# Patient Record
Sex: Female | Born: 1985
Health system: Southern US, Community
[De-identification: ages and names within clinical notes are randomized; demographics above are authoritative.]

## PROBLEM LIST (undated history)

## (undated) DIAGNOSIS — G43909 Migraine, unspecified, not intractable, without status migrainosus: Secondary | ICD-10-CM

## (undated) DIAGNOSIS — Z3400 Encounter for supervision of normal first pregnancy, unspecified trimester: Secondary | ICD-10-CM

## (undated) DIAGNOSIS — E282 Polycystic ovarian syndrome: Secondary | ICD-10-CM

## (undated) DIAGNOSIS — Z8619 Personal history of other infectious and parasitic diseases: Secondary | ICD-10-CM

## (undated) DIAGNOSIS — O24419 Gestational diabetes mellitus in pregnancy, unspecified control: Secondary | ICD-10-CM

## (undated) DIAGNOSIS — Z8669 Personal history of other diseases of the nervous system and sense organs: Secondary | ICD-10-CM

## (undated) HISTORY — PX: TOOTH EXTRACTION: SUR596

## (undated) HISTORY — DX: Personal history of other infectious and parasitic diseases: Z86.19

## (undated) HISTORY — PX: WISDOM TOOTH EXTRACTION: SHX21

## (undated) HISTORY — DX: Personal history of other diseases of the nervous system and sense organs: Z86.69

## (undated) HISTORY — PX: NO PAST SURGERIES: SHX2092

## (undated) HISTORY — DX: Gestational diabetes mellitus in pregnancy, unspecified control: O24.419

---

## 2010-04-29 DIAGNOSIS — Z8632 Personal history of gestational diabetes: Secondary | ICD-10-CM

## 2010-04-29 HISTORY — DX: Personal history of gestational diabetes: Z86.32

## 2010-04-29 NOTE — L&D Delivery Note (Signed)
Delivery Note Pt pushed for about 45 min with great effort to deliver.  At 1:50 PM a viable female was delivered via Vaginal, Spontaneous Delivery (Presentation: Left Occiput Anterior).  APGAR: 9, 9; weight 7#2.8oz .   Placenta status: Intact, Spontaneous.  Cord: 3 vessels with the following complications: None.    Anesthesia: Epidural  Episiotomy: None Lacerations: 2nd degree;Labial Suture Repair: 3.0 vicryl rapide Est. Blood Loss (mL): 300  Mom to postpartum.  Baby to nursery-stable.  Br, A+, ?Contra  BOVARD,Jayjay Littles 01/11/2011, 2:13 PM

## 2010-07-06 LAB — ANTIBODY SCREEN: Antibody Screen: NEGATIVE

## 2010-07-06 LAB — RUBELLA ANTIBODY, IGM: Rubella: IMMUNE

## 2010-07-06 LAB — ABO/RH: RH Type: POSITIVE

## 2010-07-06 LAB — HEPATITIS B SURFACE ANTIGEN: Hepatitis B Surface Ag: NEGATIVE

## 2011-01-11 ENCOUNTER — Inpatient Hospital Stay (HOSPITAL_COMMUNITY): Payer: BC Managed Care – PPO | Admitting: Anesthesiology

## 2011-01-11 ENCOUNTER — Inpatient Hospital Stay (HOSPITAL_COMMUNITY)
Admission: AD | Admit: 2011-01-11 | Discharge: 2011-01-13 | DRG: 373 | Disposition: A | Discharge status: Home / Self Care | Payer: BC Managed Care – PPO | Source: Ambulatory Visit | Attending: Obstetrics and Gynecology | Admitting: Obstetrics and Gynecology

## 2011-01-11 ENCOUNTER — Encounter (HOSPITAL_COMMUNITY): Payer: Self-pay | Admitting: *Deleted

## 2011-01-11 ENCOUNTER — Encounter (HOSPITAL_COMMUNITY): Payer: Self-pay | Admitting: Anesthesiology

## 2011-01-11 DIAGNOSIS — D689 Coagulation defect, unspecified: Secondary | ICD-10-CM | POA: Diagnosis not present

## 2011-01-11 DIAGNOSIS — D696 Thrombocytopenia, unspecified: Secondary | ICD-10-CM | POA: Diagnosis not present

## 2011-01-11 DIAGNOSIS — Z3400 Encounter for supervision of normal first pregnancy, unspecified trimester: Secondary | ICD-10-CM

## 2011-01-11 DIAGNOSIS — E282 Polycystic ovarian syndrome: Secondary | ICD-10-CM

## 2011-01-11 HISTORY — DX: Encounter for supervision of normal first pregnancy, unspecified trimester: Z34.00

## 2011-01-11 HISTORY — DX: Polycystic ovarian syndrome: E28.2

## 2011-01-11 LAB — CBC
HCT: 36.9 % (ref 36.0–46.0)
HCT: 35.6 % — ABNORMAL LOW (ref 36.0–46.0)
Hemoglobin: 12.2 g/dL (ref 12.0–15.0)
Hemoglobin: 12.1 g/dL (ref 12.0–15.0)
MCH: 32.1 pg (ref 26.0–34.0)
MCH: 32.5 pg (ref 26.0–34.0)
MCHC: 34.7 g/dL (ref 30.0–36.0)
MCHC: 34.3 g/dL (ref 30.0–36.0)
MCHC: 34.7 g/dL (ref 30.0–36.0)
MCV: 93.7 fL (ref 78.0–100.0)
MCV: 93.8 fL (ref 78.0–100.0)
RDW: 13 % (ref 11.5–15.5)

## 2011-01-11 MED ORDER — CITRIC ACID-SODIUM CITRATE 334-500 MG/5ML PO SOLN: 30.0000 mL | ORAL | Status: DC | PRN

## 2011-01-11 MED ORDER — LACTATED RINGERS IV SOLN: INTRAVENOUS | Status: DC

## 2011-01-11 MED ORDER — LIDOCAINE HCL (PF) 1 % IJ SOLN
30.0000 mL | INTRAMUSCULAR | Status: DC | PRN
  Filled 2011-01-11 (×2): qty 30

## 2011-01-11 MED ORDER — IBUPROFEN 600 MG PO TABS
600.0000 mg | ORAL_TABLET | Freq: Four times a day (QID) | ORAL | Status: DC
  Administered 2011-01-11 – 2011-01-13 (×7): 600 mg via ORAL
  Filled 2011-01-11 (×6): qty 1

## 2011-01-11 MED ORDER — BUTORPHANOL TARTRATE 2 MG/ML IJ SOLN
INTRAMUSCULAR | Status: AC
  Filled 2011-01-11: qty 1

## 2011-01-11 MED ORDER — SODIUM CHLORIDE 0.9 % IJ SOLN: 3.0000 mL | Freq: Two times a day (BID) | INTRAMUSCULAR | Status: DC

## 2011-01-11 MED ORDER — FENTANYL 2.5 MCG/ML BUPIVACAINE 1/10 % EPIDURAL INFUSION (WH - ANES)
14.0000 mL/h | INTRAMUSCULAR | Status: DC
  Administered 2011-01-11 (×2): 14 mL/h via EPIDURAL
  Filled 2011-01-11 (×3): qty 60

## 2011-01-11 MED ORDER — LIDOCAINE HCL 1.5 % IJ SOLN
INTRAMUSCULAR | Status: DC | PRN
  Administered 2011-01-11 (×2): 4 mL via EPIDURAL

## 2011-01-11 MED ORDER — LACTATED RINGERS IV SOLN
500.0000 mL | INTRAVENOUS | Status: DC | PRN
  Administered 2011-01-11: 1000 mL via INTRAVENOUS

## 2011-01-11 MED ORDER — SENNOSIDES-DOCUSATE SODIUM 8.6-50 MG PO TABS
2.0000 | ORAL_TABLET | Freq: Every day | ORAL | Status: DC
  Administered 2011-01-11 – 2011-01-12 (×3): 2 via ORAL

## 2011-01-11 MED ORDER — ONDANSETRON HCL 4 MG PO TABS: 4.0000 mg | ORAL_TABLET | ORAL | Status: DC | PRN

## 2011-01-11 MED ORDER — DIBUCAINE 1 % RE OINT: 1.0000 "application " | TOPICAL_OINTMENT | RECTAL | Status: DC | PRN

## 2011-01-11 MED ORDER — BENZOCAINE-MENTHOL 20-0.5 % EX AERO: 1.0000 "application " | INHALATION_SPRAY | CUTANEOUS | Status: DC | PRN

## 2011-01-11 MED ORDER — PRENATAL PLUS 27-1 MG PO TABS: 1.0000 | ORAL_TABLET | Freq: Every day | ORAL | Status: DC

## 2011-01-11 MED ORDER — OXYCODONE-ACETAMINOPHEN 5-325 MG PO TABS: 1.0000 | ORAL_TABLET | ORAL | Status: DC | PRN

## 2011-01-11 MED ORDER — EPHEDRINE 5 MG/ML INJ
10.0000 mg | INTRAVENOUS | Status: DC | PRN
  Filled 2011-01-11: qty 4

## 2011-01-11 MED ORDER — DIPHENHYDRAMINE HCL 50 MG/ML IJ SOLN: 12.5000 mg | INTRAMUSCULAR | Status: DC | PRN

## 2011-01-11 MED ORDER — DIPHENHYDRAMINE HCL 25 MG PO CAPS: 25.0000 mg | ORAL_CAPSULE | Freq: Four times a day (QID) | ORAL | Status: DC | PRN

## 2011-01-11 MED ORDER — OXYTOCIN 20 UNITS IN LACTATED RINGERS INFUSION - SIMPLE: 125.0000 mL/h | INTRAVENOUS | Status: DC | PRN

## 2011-01-11 MED ORDER — PHENYLEPHRINE 40 MCG/ML (10ML) SYRINGE FOR IV PUSH (FOR BLOOD PRESSURE SUPPORT)
80.0000 ug | PREFILLED_SYRINGE | INTRAVENOUS | Status: DC | PRN
  Filled 2011-01-11 (×2): qty 5

## 2011-01-11 MED ORDER — SODIUM CHLORIDE 0.9 % IV SOLN: 250.0000 mL | INTRAVENOUS | Status: DC

## 2011-01-11 MED ORDER — ZOLPIDEM TARTRATE 5 MG PO TABS: 5.0000 mg | ORAL_TABLET | Freq: Every evening | ORAL | Status: DC | PRN

## 2011-01-11 MED ORDER — BUTORPHANOL TARTRATE 2 MG/ML IJ SOLN
1.0000 mg | INTRAMUSCULAR | Status: DC | PRN
  Administered 2011-01-11: 1 mg via INTRAVENOUS

## 2011-01-11 MED ORDER — OXYTOCIN 20 UNITS IN LACTATED RINGERS INFUSION - SIMPLE
1.0000 m[IU]/min | INTRAVENOUS | Status: DC
  Administered 2011-01-11: 333 m[IU]/min via INTRAVENOUS
  Filled 2011-01-11: qty 1000

## 2011-01-11 MED ORDER — LACTATED RINGERS IV SOLN
INTRAVENOUS | Status: DC
  Administered 2011-01-11: 07:00:00 via INTRAVENOUS

## 2011-01-11 MED ORDER — PHENYLEPHRINE 40 MCG/ML (10ML) SYRINGE FOR IV PUSH (FOR BLOOD PRESSURE SUPPORT)
80.0000 ug | PREFILLED_SYRINGE | INTRAVENOUS | Status: DC | PRN
  Filled 2011-01-11: qty 5

## 2011-01-11 MED ORDER — LANOLIN HYDROUS EX OINT: TOPICAL_OINTMENT | CUTANEOUS | Status: DC | PRN

## 2011-01-11 MED ORDER — FLEET ENEMA 7-19 GM/118ML RE ENEM: 1.0000 | ENEMA | RECTAL | Status: DC | PRN

## 2011-01-11 MED ORDER — FENTANYL 2.5 MCG/ML BUPIVACAINE 1/10 % EPIDURAL INFUSION (WH - ANES)
INTRAMUSCULAR | Status: DC | PRN
  Administered 2011-01-11: 14 mL/h via EPIDURAL

## 2011-01-11 MED ORDER — SODIUM CHLORIDE 0.9 % IJ SOLN: 3.0000 mL | INTRAMUSCULAR | Status: DC | PRN

## 2011-01-11 MED ORDER — LACTATED RINGERS IV SOLN
500.0000 mL | Freq: Once | INTRAVENOUS | Status: AC
  Administered 2011-01-11: 1000 mL via INTRAVENOUS

## 2011-01-11 MED ORDER — WITCH HAZEL-GLYCERIN EX PADS: 1.0000 "application " | MEDICATED_PAD | CUTANEOUS | Status: DC | PRN

## 2011-01-11 MED ORDER — EPHEDRINE 5 MG/ML INJ
10.0000 mg | INTRAVENOUS | Status: DC | PRN
  Filled 2011-01-11 (×2): qty 4

## 2011-01-11 MED ORDER — TETANUS-DIPHTH-ACELL PERTUSSIS 5-2.5-18.5 LF-MCG/0.5 IM SUSP
0.5000 mL | Freq: Once | INTRAMUSCULAR | Status: AC
  Administered 2011-01-12: 0.5 mL via INTRAMUSCULAR
  Filled 2011-01-11: qty 0.5

## 2011-01-11 MED ORDER — SIMETHICONE 80 MG PO CHEW: 80.0000 mg | CHEWABLE_TABLET | ORAL | Status: DC | PRN

## 2011-01-11 MED ORDER — TERBUTALINE SULFATE 1 MG/ML IJ SOLN: 0.2500 mg | Freq: Once | INTRAMUSCULAR | Status: DC | PRN

## 2011-01-11 MED ORDER — ONDANSETRON HCL 4 MG/2ML IJ SOLN: 4.0000 mg | Freq: Four times a day (QID) | INTRAMUSCULAR | Status: DC | PRN

## 2011-01-11 MED ORDER — ONDANSETRON HCL 4 MG/2ML IJ SOLN: 4.0000 mg | INTRAMUSCULAR | Status: DC | PRN

## 2011-01-11 MED ORDER — PRENATAL PLUS 27-1 MG PO TABS
1.0000 | ORAL_TABLET | Freq: Every day | ORAL | Status: DC
  Administered 2011-01-12 – 2011-01-13 (×2): 1 via ORAL
  Filled 2011-01-11 (×2): qty 1

## 2011-01-11 NOTE — Progress Notes (Signed)
Pt reports uc's q 3-4 minutes for 2 hours

## 2011-01-11 NOTE — Progress Notes (Signed)
Pt G1 at 39.4 having contractions every 4 min x 2 hrs.  Pt denies any problems with pregnancy.

## 2011-01-11 NOTE — Progress Notes (Signed)
Pt transferred to [facility] at 1655.  PLTS=83.  MD called by this RN to verify giving scheduled motrin.  Dr Ellyn Hack verified to give motrin as ordered.

## 2011-01-11 NOTE — Progress Notes (Signed)
01/11/11 0501  Provider Notification  Provider Name/Title Oliver Pila, MD  Method of Notification Phone  provider called for update on pt. Notified about platelet, epidural, SVE, UC. No orders given

## 2011-01-11 NOTE — Anesthesia Postprocedure Evaluation (Signed)
  Anesthesia Post-op Note  Patient: Carol Foster  Procedure(s) Performed: * Lumbar Epidural for L & D*  Patient Location: PACU and Mother/Baby  Anesthesia Type: Epidural  Level of Consciousness: awake, alert  and oriented  Airway and Oxygen Therapy: Patient Spontanous Breathing  Post-op Pain: none  Post-op Assessment: Post-op Vital signs reviewed, Patient's Cardiovascular Status Stable, Respiratory Function Stable, Patent Airway, No signs of Nausea or vomiting, Adequate PO intake, Pain level controlled, No headache, No backache, No residual numbness and No residual motor weakness  Post-op Vital Signs: Reviewed and stable  Complications: No apparent anesthesia complications

## 2011-01-11 NOTE — H&P (Signed)
Carol Foster is a 25 y.o. female G1P0 at 69 4/7 weeks ( EDD 01/14/11 by LMP c/w 8 weeks [place]) presented to MAU at 200am with regular ctx.  No ROM.  Cervix there was 3/90/-1.  Admitted in early labor for pain control.  Prenatal care significant for h/o PCOS, conceived on Femara and Metformin with Dr. April Manson,  No other issues.  Had normal platelets in prenatal care, however here on admission were 91,000 and 88,000 on repeat c/w gestational thrombocytopenia.  Anesthesia did clear for the epidural. BP is WNL.  Maternal Medical History:  Reason for admission: Reason for admission: contractions.  Contractions: Onset was 3-5 hours ago.   Frequency: regular.   Perceived severity is strong.      OB History    Grav Para Term Preterm Abortions TAB SAB Ect Mult Living   1 0 0 0 0 0 0 0 0 0      Past Medical History  Diagnosis Date  . Polycystic ovarian syndrome    Past Surgical History  Procedure Date  . Tooth extraction    Family History: family history includes Cancer in her maternal aunt and mother and Diabetes in her paternal uncle. Social History:  reports that she has never smoked. She does not have any smokeless tobacco history on file. She reports that she does not drink alcohol or use illicit drugs.  ROS negative  Dilation: 4 Effacement (%): 90 Station: -2 Exam by:: ansah-mensah, rnc Blood pressure 117/71, pulse 82, temperature 98 F (36.7 C), temperature source Oral, resp. rate 20, height 5\' 4"  (1.626 m), weight 64.048 kg (141 lb 3.2 oz). Maternal Exam:  Uterine Assessment: Contraction strength is moderate.  Contraction frequency is regular.   Abdomen: Patient reports no abdominal tenderness. Fetal presentation: vertex  Introitus: Normal vulva. Normal vagina.    Physical Exam  Constitutional: She is oriented to person, place, and time. She appears well-developed.  Cardiovascular: Normal rate and regular rhythm.   Respiratory: Effort normal and breath sounds normal.    GI: Soft.  Neurological: She is alert and oriented to person, place, and time.    Prenatal labs: ABO, Rh: A/Positive/-- (03/09 0000) Antibody: Negative (03/09 0000) Rubella:  Immune RPR: Nonreactive (06/28 0000)  HBsAg: Negative (03/09 0000)  HIV: Non-reactive (06/28 0000)  GBS: Negative (09/14 0000)  First Trimester Screen WNL AFP WNL One hour glucola 137 3 hour Glucola WNL GC neg Chlam neg HAd a normal anatomy screening US 08/20/10, female fetus Assessment/Plan: Pt  had delay in receiving epidural due to her thrombocytopenia, will need repeat platelet check prior to removal.  She received Stadol while waiting, and FHR lost some variability but still reassuring.  Plan AROM and augmentation with Pitocin if pt slows her progress.   Oliver Pila 01/11/2011, 5:29 AM

## 2011-01-11 NOTE — Progress Notes (Signed)
Carol Foster is a 25 y.o. G1P0000 at [redacted]w[redacted]d  admitted for early labor  Subjective: comf with epidural  Objective: BP 126/85  Pulse 91  Temp(Src) 98.3 F (36.8 C) (Oral)  Resp 18  Ht 5\' 4"  (1.626 m)  Wt 64.048 kg (141 lb 3.2 oz)  BMI 24.24 kg/m2     gen NAD FHT:  FHR: 130 bpm, variability: moderate,  accelerations:  Present,  decelerations:  Absent UC:   regular, every 3 minutes SVE:   Dilation: 5.5 Effacement (%): 90 Station: 0 Exam by:: Dr. Ellyn Hack  Labs: Lab Results  Component Value Date   WBC 14.7* 01/11/2011   HGB 12.2 01/11/2011   HCT 35.6* 01/11/2011   MCV 93.7 01/11/2011   PLT 86* 01/11/2011    Assessment / Plan: Augmentation of labor, progressing well  Labor: Progressing normally Preeclampsia:  no signs or symptoms of toxicity Fetal Wellbeing:  Category II Pain Control:  Epidural  Anticipated MOD:  NSVD  BOVARD,Tamar Miano 01/11/2011, 8:45 AM

## 2011-01-11 NOTE — Progress Notes (Signed)
Cbc result back. Platelet 86. Dr. Malen Gauze notified. Will come to talk with pt

## 2011-01-11 NOTE — Anesthesia Procedure Notes (Signed)
Epidural Patient location during procedure: OB Start time: 01/11/2011 4:24 AM  Staffing Anesthesiologist: Pricsilla Lindvall A. Performed by: anesthesiologist   Preanesthetic Checklist Completed: patient identified, site marked, surgical consent, pre-op evaluation, timeout performed, IV checked, risks and benefits discussed and monitors and equipment checked  Epidural Patient position: sitting Prep: site prepped and draped and DuraPrep Patient monitoring: continuous pulse ox and blood pressure Approach: midline Injection technique: LOR air  Needle:  Needle type: Tuohy  Needle gauge: 17 G Needle length: 9 cm Needle insertion depth: 5 cm cm Catheter type: closed end flexible Catheter size: 19 Gauge Catheter at skin depth: 10 cm Test dose: negative and 1.5% lidocaine  Assessment Events: blood not aspirated, injection not painful, no injection resistance, negative IV test and no paresthesia  Additional Notes Patient is more comfortable after epidural dosed. Please see RN's note for documentation of vital signs and FHR which are stable.

## 2011-01-11 NOTE — Progress Notes (Signed)
01/11/11 0320  Provider Notification  Provider Name/Title Angelica Pou, MD  Method of Notification Phone   Pt's platelet is 20, Dr. Malen Gauze notified. Wants cbc repeated

## 2011-01-11 NOTE — Progress Notes (Signed)
  01/11/11  SVE 10/100/+2, no c/o pressure 140's mod var Ctx q 2-78min Will start pushing.  JBOVARD

## 2011-01-11 NOTE — Anesthesia Preprocedure Evaluation (Addendum)
Anesthesia Evaluation  Name, MR# and DOB Patient awake  General Assessment Comment  Reviewed: Allergy & Precautions, H&P , Patient's Chart, lab work & pertinent test results  Airway Mallampati: II TM Distance: >3 FB Neck ROM: full    Dental No notable dental hx. (+) Teeth Intact   Pulmonary  clear to auscultation  pulmonary exam normalPulmonary Exam Normal breath sounds clear to auscultation none    Cardiovascular regular Normal    Neuro/Psych Negative Neurological ROS  Negative Psych ROS  GI/Hepatic/Renal negative GI ROS  negative Liver ROS  negative Renal ROS        Endo/Other  Negative Endocrine ROS (+)    Was treated for Polycystic Ovarian Syndrome prior to pregnancy  Abdominal   Musculoskeletal   Hematology negative hematology ROS (+) Patient has thrombocytopenia this admission, probably gestationnal related. No evidence of spontaneous hemorrhage.   Peds  Reproductive/Obstetrics (+) Pregnancy    Anesthesia Other Findings Will repeat CBC to check platelet count prior to removal of epidural catheter.            Anesthesia Physical Anesthesia Plan  ASA: II  Anesthesia Plan: Epidural   Post-op Pain Management:    Induction:   Airway Management Planned:   Additional Equipment:   Intra-op Plan:   Post-operative Plan:   Informed Consent: I have reviewed the patients History and Physical, chart, labs and discussed the procedure including the risks, benefits and alternatives for the proposed anesthesia with the patient or authorized representative who has indicated his/her understanding and acceptance.     Plan Discussed with: Anesthesiologist  Anesthesia Plan Comments:         Anesthesia Quick Evaluation

## 2011-01-12 LAB — CBC
HCT: 31.6 % — ABNORMAL LOW (ref 36.0–46.0)
MCH: 32.4 pg (ref 26.0–34.0)
MCHC: 34.5 g/dL (ref 30.0–36.0)
MCV: 94 fL (ref 78.0–100.0)
Platelets: 67 10*3/uL — ABNORMAL LOW (ref 150–400)
RDW: 13.2 % (ref 11.5–15.5)

## 2011-01-12 MED ORDER — INFLUENZA VIRUS VACC SPLIT PF IM SUSP
0.5000 mL | Freq: Once | INTRAMUSCULAR | Status: AC
  Administered 2011-01-13: 0.5 mL via INTRAMUSCULAR
  Filled 2011-01-12: qty 0.5

## 2011-01-12 MED ORDER — INFLUENZA VIRUS VACC SPLIT PF IM SUSP
0.5000 mL | Freq: Once | INTRAMUSCULAR | Status: DC
  Filled 2011-01-12: qty 0.5

## 2011-01-12 MED ORDER — INFLUENZA VIRUS VACC SPLIT PF IM SUSP
0.5000 mL | Freq: Once | INTRAMUSCULAR | Status: DC
Start: 2011-01-12 — End: 2011-01-12
  Filled 2011-01-12: qty 0.5

## 2011-01-12 NOTE — Progress Notes (Signed)
Post Partum Day 1 Subjective: no complaints, up ad lib, tolerating PO and nl lochia, pain controlled  Objective: Blood pressure 110/64, pulse 87, temperature 98.2 F (36.8 C), temperature source Oral, resp. rate 18, height 5\' 4"  (1.626 m), weight 64.048 kg (141 lb 3.2 oz), unknown if currently breastfeeding.  Physical Exam:  General: alert and no distress Lochia: appropriate Uterine Fundus: firm    Basename 01/12/11 0528 01/11/11 1444  HGB 10.9* 12.1  HCT 31.6* 34.9*    Assessment/Plan: Plan for discharge tomorrow and Breastfeeding   Pt with decreasing plts, aymptomatic.  Will check in AM   LOS: 1 day   BOVARD,Rashunda Passon 01/12/2011, 9:47 AM

## 2011-01-13 LAB — CBC
Hemoglobin: 10.9 g/dL — ABNORMAL LOW (ref 12.0–15.0)
MCH: 32.4 pg (ref 26.0–34.0)
MCHC: 34 g/dL (ref 30.0–36.0)
Platelets: 96 10*3/uL — ABNORMAL LOW (ref 150–400)
RDW: 13.2 % (ref 11.5–15.5)

## 2011-01-13 MED ORDER — IBUPROFEN 800 MG PO TABS: 800.0000 mg | ORAL_TABLET | Freq: Three times a day (TID) | ORAL | Status: AC | PRN

## 2011-01-13 MED ORDER — PRENATAL PLUS 27-1 MG PO TABS: 1.0000 | ORAL_TABLET | Freq: Every day | ORAL | Status: DC

## 2011-01-13 MED ORDER — OXYCODONE-ACETAMINOPHEN 5-325 MG PO TABS: 1.0000 | ORAL_TABLET | ORAL | Status: AC | PRN

## 2011-01-13 NOTE — Discharge Summary (Signed)
Obstetric Discharge Summary Reason for Admission: onset of labor Prenatal Procedures: none Intrapartum Procedures: spontaneous vaginal delivery Postpartum Procedures: none Complications-Operative and Postpartum: none Hemoglobin  Date Value Range Status  01/13/2011 10.9* 12.0-15.0 (g/dL) Final     HCT  Date Value Range Status  01/13/2011 32.1* 36.0-46.0 (%) Final    Discharge Diagnoses: Term Pregnancy-delivered  Discharge Information: Date: 01/13/2011 Activity: pelvic rest Diet: routine Medications: PNV, Ibuprophen and Percocet Condition: stable Instructions: refer to practice specific booklet Discharge to: home   Newborn Data: Live born female  Birth Weight: 7 lb 2.8 oz (3255 g) APGAR: 9, 9  Home with mother.  BOVARD,Ruperto Kiernan 01/13/2011, 10:11 AM

## 2011-01-13 NOTE — Progress Notes (Signed)
Post Partum Day 2 Subjective: no complaints and nl lochia, pain controlled  Objective: Blood pressure 114/75, pulse 71, temperature 98 F (36.7 C), temperature source Oral, resp. rate 18, height 5\' 4"  (1.626 m), weight 64.048 kg (141 lb 3.2 oz), unknown if currently breastfeeding.  Physical Exam:  General: alert and no distress Lochia: appropriate Uterine Fundus: firm    Basename 01/13/11 0551 01/12/11 0528  HGB 10.9* 10.9*  HCT 32.1* 31.6*    Assessment/Plan: Discharge home and Breastfeeding  D/c with Motrin, Percocet, PNV.  F/u 6 wks or prn   LOS: 2 days   BOVARD,Shauntavia Brackin 01/13/2011, 9:45 AM

## 2013-08-12 ENCOUNTER — Emergency Department: Payer: Self-pay | Admitting: Emergency Medicine

## 2013-08-12 LAB — CBC
HCT: 38.9 % (ref 35.0–47.0)
HGB: 13.5 g/dL (ref 12.0–16.0)
MCH: 31.5 pg (ref 26.0–34.0)
MCHC: 34.6 g/dL (ref 32.0–36.0)
MCV: 91 fL (ref 80–100)
PLATELETS: 183 10*3/uL (ref 150–440)
RBC: 4.28 10*6/uL (ref 3.80–5.20)
RDW: 13.8 % (ref 11.5–14.5)
WBC: 8.7 10*3/uL (ref 3.6–11.0)

## 2013-08-12 LAB — BASIC METABOLIC PANEL
Anion Gap: 5 — ABNORMAL LOW (ref 7–16)
BUN: 7 mg/dL (ref 7–18)
CHLORIDE: 104 mmol/L (ref 98–107)
CREATININE: 0.57 mg/dL — AB (ref 0.60–1.30)
Calcium, Total: 8.6 mg/dL (ref 8.5–10.1)
Co2: 26 mmol/L (ref 21–32)
EGFR (African American): >60
Glucose: 91 mg/dL (ref 65–99)
Osmolality: 268 (ref 275–301)
Potassium: 3.4 mmol/L — ABNORMAL LOW (ref 3.5–5.1)
Sodium: 135 mmol/L — ABNORMAL LOW (ref 136–145)

## 2013-08-13 LAB — D-DIMER(ARMC): D-DIMER: 341 ng/mL

## 2013-08-13 LAB — TROPONIN I

## 2013-09-15 LAB — OB RESULTS CONSOLE HIV ANTIBODY (ROUTINE TESTING): HIV: NONREACTIVE

## 2013-09-15 LAB — OB RESULTS CONSOLE HEPATITIS B SURFACE ANTIGEN: Hepatitis B Surface Ag: NEGATIVE

## 2013-09-15 LAB — OB RESULTS CONSOLE ABO/RH: RH TYPE: POSITIVE

## 2013-09-15 LAB — OB RESULTS CONSOLE RPR: RPR: NONREACTIVE

## 2013-09-15 LAB — OB RESULTS CONSOLE GC/CHLAMYDIA
Chlamydia: NEGATIVE
Gonorrhea: NEGATIVE

## 2013-09-15 LAB — OB RESULTS CONSOLE ANTIBODY SCREEN: Antibody Screen: NEGATIVE

## 2013-09-15 LAB — OB RESULTS CONSOLE RUBELLA ANTIBODY, IGM: Rubella: IMMUNE

## 2014-01-05 ENCOUNTER — Encounter: Payer: BC Managed Care – PPO | Attending: Obstetrics and Gynecology

## 2014-01-05 VITALS — Ht 64.0 in | Wt 136.4 lb

## 2014-01-05 DIAGNOSIS — O99810 Abnormal glucose complicating pregnancy: Secondary | ICD-10-CM

## 2014-01-05 DIAGNOSIS — Z713 Dietary counseling and surveillance: Secondary | ICD-10-CM | POA: Insufficient documentation

## 2014-01-11 NOTE — Progress Notes (Signed)
  Patient was seen on 01/05/14 for Gestational Diabetes self-management . The following learning objectives were met by the patient :   States the definition of Gestational Diabetes  States why dietary management is important in controlling blood glucose  Describes the effects of carbohydrates on blood glucose levels  Demonstrates ability to create a balanced meal plan  Demonstrates carbohydrate counting   States when to check blood glucose levels  Demonstrates proper blood glucose monitoring techniques  States the effect of stress and exercise on blood glucose levels  States the importance of limiting caffeine and abstaining from alcohol and smoking  Plan:  Aim for 2 Carb Choices per meal (30 grams) +/- 1 either way for breakfast Aim for 3 Carb Choices per meal (45 grams) +/- 1 either way from lunch and dinner Aim for 1-2 Carbs per snack Begin reading food labels for Total Carbohydrate and sugar grams of foods Consider  increasing your activity level by walking daily as tolerated Begin checking BG before breakfast and 2 hours after first bit of breakfast, lunch and dinner after  as directed by MD  Take medication  as directed by MD  Blood glucose monitor given: One Touch Ultra 2 Lot # [id number] X Exp: 08/2014 Blood glucose reading: $RemoveBeforeDE'127mg'tCiXYOPGkBZaJVT$ /dl  Patient instructed to monitor glucose levels: FBS: 60 - <90 2 hour: <120  Patient received the following handouts:  Nutrition Diabetes and Pregnancy  Carbohydrate Counting List  Meal Planning worksheet  Patient will be seen for follow-up as needed.

## 2014-01-20 ENCOUNTER — Inpatient Hospital Stay (HOSPITAL_COMMUNITY)
Admission: AD | Admit: 2014-01-20 | Discharge: 2014-01-20 | Disposition: A | Discharge status: Home / Self Care | Payer: BC Managed Care – PPO | Source: Ambulatory Visit | Attending: Obstetrics and Gynecology | Admitting: Obstetrics and Gynecology

## 2014-01-20 ENCOUNTER — Encounter (HOSPITAL_COMMUNITY): Payer: Self-pay | Admitting: *Deleted

## 2014-01-20 DIAGNOSIS — R109 Unspecified abdominal pain: Secondary | ICD-10-CM | POA: Diagnosis present

## 2014-01-20 DIAGNOSIS — O479 False labor, unspecified: Secondary | ICD-10-CM

## 2014-01-20 DIAGNOSIS — O4700 False labor before 37 completed weeks of gestation, unspecified trimester: Secondary | ICD-10-CM | POA: Diagnosis not present

## 2014-01-20 DIAGNOSIS — M79609 Pain in unspecified limb: Secondary | ICD-10-CM | POA: Diagnosis not present

## 2014-01-20 LAB — URINALYSIS, ROUTINE W REFLEX MICROSCOPIC
BILIRUBIN URINE: NEGATIVE
Glucose, UA: 250 mg/dL — AB
HGB URINE DIPSTICK: NEGATIVE
Ketones, ur: NEGATIVE mg/dL
Leukocytes, UA: NEGATIVE
NITRITE: NEGATIVE
PH: 5.5 (ref 5.0–8.0)
Protein, ur: 30 mg/dL — AB
Urobilinogen, UA: 1 mg/dL (ref 0.0–1.0)

## 2014-01-20 LAB — URINE MICROSCOPIC-ADD ON

## 2014-01-20 NOTE — MAU Provider Note (Signed)
History     CSN: [id number]  Arrival date and time: 01/20/14 1738   First Provider Initiated Contact with Patient 01/20/14 1822      Chief Complaint  Patient presents with  . Abdominal Cramping  . Leg Pain   Abdominal Cramping Pertinent negatives include no fever.  Leg Pain     Pt is a 28 yo G2P1001 at [redacted]w[redacted]d wks IUP here with report of bilat leg cramps that began two weeks ago.  Feet are also painful.  Abdominal pain started two weeks ago. Tender to the touch.  Intermittent contractions occuring 3-5x/hour.  Denies vaginal bleeding or leaking of fluid.  Hx of one term vaginal delivery.     Past Medical History  Diagnosis Date  . Polycystic ovarian syndrome   . Normal pregnancy, first 01/11/2011  . SVD (spontaneous vaginal delivery) 01/11/2011  . Gestational diabetes mellitus, antepartum     Past Surgical History  Procedure Laterality Date  . Tooth extraction      Family History  Problem Relation Age of Onset  . Cancer Mother   . Cancer Maternal Aunt   . Diabetes Paternal Uncle     History  Substance Use Topics  . Smoking status: Never Smoker   . Smokeless tobacco: Not on file  . Alcohol Use: No    Allergies: No Known Allergies  Prescriptions prior to admission  Medication Sig Dispense Refill  . acetaminophen (TYLENOL) 500 MG tablet Take 500 mg by mouth every 6 (six) hours as needed. For pain       . Prenatal Vit-Fe Fumarate-FA (PRENATAL MULTIVITAMIN) TABS tablet Take 1 tablet by mouth daily at 12 noon.        Review of Systems  Constitutional: Negative for fever and chills.  Gastrointestinal: Positive for abdominal pain (tender to the touch; contractions).  Genitourinary: Negative.   Musculoskeletal:       Leg and feet pain  All other systems reviewed and are negative.  Physical Exam   Blood pressure 112/64, pulse 106, temperature 98 F (36.7 C), resp. rate 16, height 5' 1.5" (1.562 m), weight 59.421 kg (131 lb), unknown if currently  breastfeeding.  Physical Exam  Constitutional: She is oriented to person, place, and time. She appears well-developed and well-nourished.  HENT:  Head: Normocephalic.  Neck: Normal range of motion. Neck supple.  Cardiovascular: Normal rate, regular rhythm and normal heart sounds.   Respiratory: Effort normal and breath sounds normal.  GI: Soft. There is tenderness (lower pelvis).  Genitourinary: No bleeding around the vagina. Vaginal discharge (mucusy) found.  Musculoskeletal: Normal range of motion. She exhibits no edema and no tenderness.  Neurological: She is alert and oriented to person, place, and time.  Skin: Skin is warm and dry.   FHR 130's, +accels, reactive Toco - irritability   Consulted with Dr. Senaida Ores > reviewed HPI/exam/cervical check/FHR testing > discharge to home, encourage to increase fluids > keep scheduled appt for next week MAU Course  Procedures Results for orders placed during the hospital encounter of 01/20/14 (from the past 24 hour(s))  URINALYSIS, ROUTINE W REFLEX MICROSCOPIC     Status: Abnormal   Collection Time    01/20/14  6:20 PM      Result Value Ref Range   Color, Urine YELLOW  YELLOW   APPearance TURBID (*) CLEAR   Specific Gravity, Urine >1.030 (*) 1.005 - 1.030   pH 5.5  5.0 - 8.0   Glucose, UA 250 (*) NEGATIVE mg/dL   Hgb urine dipstick  NEGATIVE  NEGATIVE   Bilirubin Urine NEGATIVE  NEGATIVE   Ketones, ur NEGATIVE  NEGATIVE mg/dL   Protein, ur 30 (*) NEGATIVE mg/dL   Urobilinogen, UA 1.0  0.0 - 1.0 mg/dL   Nitrite NEGATIVE  NEGATIVE   Leukocytes, UA NEGATIVE  NEGATIVE  URINE MICROSCOPIC-ADD ON     Status: None   Collection Time    01/20/14  6:20 PM      Result Value Ref Range   Squamous Epithelial / LPF RARE  RARE   WBC, UA 0-2  <3 WBC/hpf   RBC / HPF 0-2  <3 RBC/hpf   Bacteria, UA RARE  RARE   Urine-Other AMORPHOUS URATES/PHOSPHATES       Assessment and Plan  28 yo G2P1001 G2P1001 at [redacted]w[redacted]d wks IUP Braxton Hick's  Leg  Pain  Plan: Discharge to home Reviewed preterm labor precautions Increase fluids Keep scheduled appointment   Rochele Pages N 01/20/2014, 6:24 PM   NST reviewed and category 1 with no decels.

## 2014-01-20 NOTE — MAU Note (Signed)
Leg cramps, feet are killing her.  abd is tender to touch- feels it tightening up.  Just hurts.  No hx of PTL.  Does have gest diab.

## 2014-01-20 NOTE — Discharge Instructions (Signed)
Braxton Hicks Contractions °Contractions of the uterus can occur throughout pregnancy. Contractions are not always a sign that you are in labor.  °WHAT ARE BRAXTON HICKS CONTRACTIONS?  °Contractions that occur before labor are called Braxton Hicks contractions, or false labor. Toward the end of pregnancy (32-34 weeks), these contractions can develop more often and may become more forceful. This is not true labor because these contractions do not result in opening (dilatation) and thinning of the cervix. They are sometimes difficult to tell apart from true labor because these contractions can be forceful and people have different pain tolerances. You should not feel embarrassed if you go to the hospital with false labor. Sometimes, the only way to tell if you are in true labor is for your health care provider to look for changes in the cervix. °If there are no prenatal problems or other health problems associated with the pregnancy, it is completely safe to be sent home with false labor and await the onset of true labor. °HOW CAN YOU TELL THE DIFFERENCE BETWEEN TRUE AND FALSE LABOR? °False Labor °· The contractions of false labor are usually shorter and not as hard as those of true labor.   °· The contractions are usually irregular.   °· The contractions are often felt in the front of the lower abdomen and in the groin.   °· The contractions may go away when you walk around or change positions while lying down.   °· The contractions get weaker and are shorter lasting as time goes on.   °· The contractions do not usually become progressively stronger, regular, and closer together as with true labor.   °True Labor °· Contractions in true labor last 30-70 seconds, become very regular, usually become more intense, and increase in frequency.   °· The contractions do not go away with walking.   °· The discomfort is usually felt in the top of the uterus and spreads to the lower abdomen and low back.   °· True labor can be  determined by your health care provider with an exam. This will show that the cervix is dilating and getting thinner.   °WHAT TO REMEMBER °· Keep up with your usual exercises and follow other instructions given by your health care provider.   °· Take medicines as directed by your health care provider.   °· Keep your regular prenatal appointments.   °· Eat and drink lightly if you think you are going into labor.   °· If Braxton Hicks contractions are making you uncomfortable:   °¨ Change your position from lying down or resting to walking, or from walking to resting.   °¨ Sit and rest in a tub of warm water.   °¨ Drink 2-3 glasses of water. Dehydration may cause these contractions.   °¨ Do slow and deep breathing several times an hour.   °WHEN SHOULD I SEEK IMMEDIATE MEDICAL CARE? °Seek immediate medical care if: °· Your contractions become stronger, more regular, and closer together.   °· You have fluid leaking or gushing from your vagina.   °· You have a fever.   °· You pass blood-tinged mucus.   °· You have vaginal bleeding.   °· You have continuous abdominal pain.   °· You have low back pain that you never had before.   °· You feel your baby's head pushing down and causing pelvic pressure.   °· Your baby is not moving as much as it used to.   °Document Released: 04/15/2005 Document Revised: 04/20/2013 Document Reviewed: 01/25/2013 °[employer]® Patient Information ©2015 [employer]. This information is not intended to replace advice given to you by your health care   provider. Make sure you discuss any questions you have with your health care provider. ° °

## 2014-02-22 LAB — OB RESULTS CONSOLE GBS: GBS: NEGATIVE

## 2014-02-28 ENCOUNTER — Encounter (HOSPITAL_COMMUNITY): Payer: Self-pay | Admitting: *Deleted

## 2014-03-11 ENCOUNTER — Inpatient Hospital Stay (HOSPITAL_COMMUNITY): Payer: BC Managed Care – PPO | Admitting: Anesthesiology

## 2014-03-11 ENCOUNTER — Encounter (HOSPITAL_COMMUNITY): Payer: Self-pay | Admitting: *Deleted

## 2014-03-11 ENCOUNTER — Inpatient Hospital Stay (HOSPITAL_COMMUNITY)
Admission: AD | Admit: 2014-03-11 | Discharge: 2014-03-12 | DRG: 775 | Disposition: A | Discharge status: Home / Self Care | Payer: BC Managed Care – PPO | Source: Ambulatory Visit | Attending: Obstetrics and Gynecology | Admitting: Obstetrics and Gynecology

## 2014-03-11 DIAGNOSIS — Z3A38 38 weeks gestation of pregnancy: Secondary | ICD-10-CM | POA: Diagnosis present

## 2014-03-11 DIAGNOSIS — E282 Polycystic ovarian syndrome: Secondary | ICD-10-CM

## 2014-03-11 DIAGNOSIS — O24420 Gestational diabetes mellitus in childbirth, diet controlled: Secondary | ICD-10-CM | POA: Diagnosis present

## 2014-03-11 DIAGNOSIS — Z833 Family history of diabetes mellitus: Secondary | ICD-10-CM

## 2014-03-11 DIAGNOSIS — O3483 Maternal care for other abnormalities of pelvic organs, third trimester: Secondary | ICD-10-CM | POA: Diagnosis present

## 2014-03-11 DIAGNOSIS — O24419 Gestational diabetes mellitus in pregnancy, unspecified control: Secondary | ICD-10-CM | POA: Diagnosis present

## 2014-03-11 LAB — TYPE AND SCREEN
ABO/RH(D): A POS
Antibody Screen: NEGATIVE

## 2014-03-11 LAB — HIV ANTIBODY (ROUTINE TESTING W REFLEX): HIV 1&2 Ab, 4th Generation: NONREACTIVE

## 2014-03-11 LAB — GLUCOSE, CAPILLARY
GLUCOSE-CAPILLARY: 126 mg/dL — AB (ref 70–99)
Glucose-Capillary: 81 mg/dL (ref 70–99)
Glucose-Capillary: 99 mg/dL (ref 70–99)
Glucose-Capillary: 131 mg/dL — ABNORMAL HIGH (ref 70–99)

## 2014-03-11 LAB — CBC
HEMATOCRIT: 34 % — AB (ref 36.0–46.0)
Hemoglobin: 11.4 g/dL — ABNORMAL LOW (ref 12.0–15.0)
MCH: 30.1 pg (ref 26.0–34.0)
MCHC: 33.5 g/dL (ref 30.0–36.0)
MCV: 89.7 fL (ref 78.0–100.0)
Platelets: 169 10*3/uL (ref 150–400)
RBC: 3.79 MIL/uL — AB (ref 3.87–5.11)
RDW: 14.2 % (ref 11.5–15.5)
WBC: 11.1 10*3/uL — AB (ref 4.0–10.5)

## 2014-03-11 LAB — RPR

## 2014-03-11 LAB — ABO/RH: ABO/RH(D): A POS

## 2014-03-11 MED ORDER — ONDANSETRON HCL 4 MG/2ML IJ SOLN: 4.0000 mg | Freq: Four times a day (QID) | INTRAMUSCULAR | Status: DC | PRN

## 2014-03-11 MED ORDER — LACTATED RINGERS IV SOLN: INTRAVENOUS | Status: AC

## 2014-03-11 MED ORDER — OXYCODONE-ACETAMINOPHEN 5-325 MG PO TABS: 2.0000 | ORAL_TABLET | ORAL | Status: DC | PRN

## 2014-03-11 MED ORDER — LIDOCAINE HCL (PF) 1 % IJ SOLN
30.0000 mL | INTRAMUSCULAR | Status: DC | PRN
  Filled 2014-03-11: qty 30

## 2014-03-11 MED ORDER — DIPHENHYDRAMINE HCL 25 MG PO CAPS: 25.0000 mg | ORAL_CAPSULE | Freq: Four times a day (QID) | ORAL | Status: DC | PRN

## 2014-03-11 MED ORDER — OXYCODONE-ACETAMINOPHEN 5-325 MG PO TABS: 1.0000 | ORAL_TABLET | ORAL | Status: DC | PRN

## 2014-03-11 MED ORDER — LANOLIN HYDROUS EX OINT: TOPICAL_OINTMENT | CUTANEOUS | Status: DC | PRN

## 2014-03-11 MED ORDER — ONDANSETRON HCL 4 MG/2ML IJ SOLN: 4.0000 mg | INTRAMUSCULAR | Status: DC | PRN

## 2014-03-11 MED ORDER — FENTANYL 2.5 MCG/ML BUPIVACAINE 1/10 % EPIDURAL INFUSION (WH - ANES)
14.0000 mL/h | INTRAMUSCULAR | Status: DC | PRN
  Administered 2014-03-11: 14 mL/h via EPIDURAL
  Filled 2014-03-11: qty 125

## 2014-03-11 MED ORDER — FENTANYL 2.5 MCG/ML BUPIVACAINE 1/10 % EPIDURAL INFUSION (WH - ANES)
INTRAMUSCULAR | Status: DC | PRN
Start: 2014-03-11 — End: 2014-03-11
  Administered 2014-03-11: 14 mL/h via EPIDURAL

## 2014-03-11 MED ORDER — LACTATED RINGERS IV SOLN: 500.0000 mL | Freq: Once | INTRAVENOUS | Status: DC

## 2014-03-11 MED ORDER — TETANUS-DIPHTH-ACELL PERTUSSIS 5-2.5-18.5 LF-MCG/0.5 IM SUSP: 0.5000 mL | Freq: Once | INTRAMUSCULAR | Status: DC

## 2014-03-11 MED ORDER — OXYTOCIN 40 UNITS IN LACTATED RINGERS INFUSION - SIMPLE MED: 62.5000 mL/h | INTRAVENOUS | Status: AC

## 2014-03-11 MED ORDER — BENZOCAINE-MENTHOL 20-0.5 % EX AERO
1.0000 "application " | INHALATION_SPRAY | CUTANEOUS | Status: DC | PRN
  Filled 2014-03-11: qty 56

## 2014-03-11 MED ORDER — MEASLES, MUMPS & RUBELLA VAC ~~LOC~~ INJ: 0.5000 mL | INJECTION | Freq: Once | SUBCUTANEOUS | Status: DC

## 2014-03-11 MED ORDER — PRENATAL MULTIVITAMIN CH: 1.0000 | ORAL_TABLET | Freq: Every day | ORAL | Status: DC

## 2014-03-11 MED ORDER — OXYTOCIN BOLUS FROM INFUSION
500.0000 mL | INTRAVENOUS | Status: DC
  Administered 2014-03-11: 500 mL via INTRAVENOUS

## 2014-03-11 MED ORDER — IBUPROFEN 600 MG PO TABS
600.0000 mg | ORAL_TABLET | Freq: Four times a day (QID) | ORAL | Status: DC
  Administered 2014-03-11 – 2014-03-12 (×5): 600 mg via ORAL
  Filled 2014-03-11 (×5): qty 1

## 2014-03-11 MED ORDER — PRENATAL MULTIVITAMIN CH
1.0000 | ORAL_TABLET | Freq: Every day | ORAL | Status: DC
  Administered 2014-03-11 – 2014-03-12 (×2): 1 via ORAL
  Filled 2014-03-11 (×2): qty 1

## 2014-03-11 MED ORDER — OXYTOCIN 40 UNITS IN LACTATED RINGERS INFUSION - SIMPLE MED
62.5000 mL/h | INTRAVENOUS | Status: DC
  Filled 2014-03-11: qty 1000

## 2014-03-11 MED ORDER — PHENYLEPHRINE 40 MCG/ML (10ML) SYRINGE FOR IV PUSH (FOR BLOOD PRESSURE SUPPORT)
80.0000 ug | PREFILLED_SYRINGE | INTRAVENOUS | Status: DC | PRN
  Filled 2014-03-11: qty 2
  Filled 2014-03-11: qty 10

## 2014-03-11 MED ORDER — ACETAMINOPHEN 325 MG PO TABS: 650.0000 mg | ORAL_TABLET | ORAL | Status: DC | PRN

## 2014-03-11 MED ORDER — DIBUCAINE 1 % RE OINT: 1.0000 "application " | TOPICAL_OINTMENT | RECTAL | Status: DC | PRN

## 2014-03-11 MED ORDER — FLEET ENEMA 7-19 GM/118ML RE ENEM: 1.0000 | ENEMA | RECTAL | Status: DC | PRN

## 2014-03-11 MED ORDER — DIPHENHYDRAMINE HCL 50 MG/ML IJ SOLN: 12.5000 mg | INTRAMUSCULAR | Status: DC | PRN

## 2014-03-11 MED ORDER — CITRIC ACID-SODIUM CITRATE 334-500 MG/5ML PO SOLN: 30.0000 mL | ORAL | Status: DC | PRN

## 2014-03-11 MED ORDER — ZOLPIDEM TARTRATE 5 MG PO TABS: 5.0000 mg | ORAL_TABLET | Freq: Every evening | ORAL | Status: DC | PRN

## 2014-03-11 MED ORDER — PHENYLEPHRINE 40 MCG/ML (10ML) SYRINGE FOR IV PUSH (FOR BLOOD PRESSURE SUPPORT)
80.0000 ug | PREFILLED_SYRINGE | INTRAVENOUS | Status: DC | PRN
  Filled 2014-03-11: qty 2

## 2014-03-11 MED ORDER — ONDANSETRON HCL 4 MG PO TABS: 4.0000 mg | ORAL_TABLET | ORAL | Status: DC | PRN

## 2014-03-11 MED ORDER — EPHEDRINE 5 MG/ML INJ
10.0000 mg | INTRAVENOUS | Status: DC | PRN
  Filled 2014-03-11: qty 2

## 2014-03-11 MED ORDER — LACTATED RINGERS IV SOLN
INTRAVENOUS | Status: DC
  Administered 2014-03-11 (×2): via INTRAVENOUS

## 2014-03-11 MED ORDER — WITCH HAZEL-GLYCERIN EX PADS: 1.0000 "application " | MEDICATED_PAD | CUTANEOUS | Status: DC | PRN

## 2014-03-11 MED ORDER — OXYCODONE-ACETAMINOPHEN 5-325 MG PO TABS
1.0000 | ORAL_TABLET | ORAL | Status: DC | PRN
  Administered 2014-03-11 – 2014-03-12 (×2): 1 via ORAL
  Filled 2014-03-11 (×2): qty 1

## 2014-03-11 MED ORDER — SENNOSIDES-DOCUSATE SODIUM 8.6-50 MG PO TABS
2.0000 | ORAL_TABLET | ORAL | Status: DC
  Administered 2014-03-11: 2 via ORAL
  Filled 2014-03-11: qty 2

## 2014-03-11 MED ORDER — LIDOCAINE HCL (PF) 1 % IJ SOLN
INTRAMUSCULAR | Status: DC | PRN
  Administered 2014-03-11 (×2): 4 mL

## 2014-03-11 MED ORDER — LACTATED RINGERS IV SOLN: 500.0000 mL | INTRAVENOUS | Status: DC | PRN

## 2014-03-11 MED ORDER — SIMETHICONE 80 MG PO CHEW: 80.0000 mg | CHEWABLE_TABLET | ORAL | Status: DC | PRN

## 2014-03-11 NOTE — Lactation Note (Addendum)
This note was copied from the chart of Carol Barbaraann Rondoicole Delangel.  Lactation Consultation Note  Patient Name: Carol Foster RUEAV'WToday's Date: 03/11/2014 Reason for consult: Initial assessment  Visited with Mom and FOB at 7 hrs old.  Baby has fed 5 times already with latch scores between 6-10.  Mom states she breast fed her first child who is 3, but had trouble with her milk supply.  She stated baby gained weight well, but "cried a lot" at 723 weeks of age, so they started giving formula.  Mom did not pump.  Talked about importance of a deep latch.  Mom has a history of PCOS, and Gestational Diabetes.  Assisted Mom with positioning and latching baby in football hold.  Basic teaching reviewed. Recommended skin to skin, and cue based feedings.  Encouraged Mom to have follow up OP lactation appointment to monitor her milk supply and baby's weight gain.   Brochure left in room.  Informed Mom of IP and OP lactation services available to her.  Encouraged her to call for help prn.  Follow up prn.  Consult Status Consult Status: Follow-up Date: 03/12/14 Follow-up type: In-patient    Judee ClaraSmith, Miku Udall E 03/11/2014, 3:17 PM

## 2014-03-11 NOTE — Progress Notes (Signed)
Patient ID: Carol Foster, female   DOB: November 11, 1985, 28 y.o.   MRN: [id number] Delivery note:  The pt pushed well and delivered a living female infant spontaneously LOA over an intact perineum The baby was rotated to ROT to facilitate delivery Apgars were 8 and 9 at 1 and 5 minutes. The placenta was delivered intact and the uterus was normal. EBL 400 cc's. There were no significant lacerations.

## 2014-03-11 NOTE — Anesthesia Postprocedure Evaluation (Signed)
  Anesthesia Post-op Note  Patient: Carol Foster  Procedure(s) Performed: * No procedures listed *  Patient Location: PACU and Mother/Baby  Anesthesia Type:Epidural  Level of Consciousness: awake, alert  and oriented  Airway and Oxygen Therapy: Patient Spontanous Breathing  Post-op Pain: mild  Post-op Assessment: Patient's Cardiovascular Status Stable, Respiratory Function Stable, No signs of Nausea or vomiting, Adequate PO intake, Pain level controlled, No headache, No backache, No residual numbness and No residual motor weakness  Post-op Vital Signs: Reviewed and stable  Last Vitals:  Filed Vitals:   03/11/14 1715  BP: 102/73  Pulse: 88  Temp:   Resp:     Complications: No apparent anesthesia complications

## 2014-03-11 NOTE — Progress Notes (Signed)
Patient ID: Carol Foster, female   DOB: [birth year], 28 y.o.   MRN: [id number] Pt received epidural and is comfortable afeb vss Cervix c/7-8/-1 AROM BBOW, clear  FHR overall reassuring with variability, some variable decels Follow progress.

## 2014-03-11 NOTE — H&P (Addendum)
Carol Foster is a 28 y.o. female G2P1001 at 38+ weeks (EDD11/25/15 by 10 week US) presenting for onset of labor and cervical change to 5 cm.  Prenatal care complicated by gestational diabetes controlled on glyburide 2.5mg  po q AM and 1.25 q pm.  She has been monitored with NST's twice weekly since 32 weeks.  No other issues this pregnancy.  Maternal Medical History:  Reason for admission: Contractions.   Contractions: Onset was 3-5 hours ago.   Frequency: regular.   Perceived severity is moderate.    Fetal activity: Perceived fetal activity is normal.    Prenatal Complications - Diabetes: gestational. Diabetes is managed by oral agent (monotherapy).      Past OB Hx 2012 NSVD 7#2oz  Past Medical History  Diagnosis Date  . Polycystic ovarian syndrome   . Normal pregnancy, first 01/11/2011  . SVD (spontaneous vaginal delivery) 01/11/2011  . Gestational diabetes mellitus, antepartum    Past Surgical History  Procedure Laterality Date  . Tooth extraction     Family History: family history includes Cancer in her maternal aunt and mother; Diabetes in her paternal uncle. Social History:  reports that she has never smoked. She does not have any smokeless tobacco history on file. She reports that she does not drink alcohol or use illicit drugs.   Prenatal Transfer Tool  Maternal Diabetes: Yes:  Diabetes Type:  Insulin/Medication controlled Genetic Screening: Normal Maternal Ultrasounds/Referrals: Normal Fetal Ultrasounds or other Referrals:  None Maternal Substance Abuse:  No Significant Maternal Medications:  Meds include: Other:  Glyburide Significant Maternal Lab Results:  None Other Comments:  None  ROS  Dilation: 5 Effacement (%): 90 Station: -2 Exam by:: Weston,RN Blood pressure 98/68, pulse 122, temperature 98.4 F (36.9 C), temperature source Oral, resp. rate 18, height 5\' 6"  (1.676 m), weight 63.957 kg (141 lb), SpO2 100 %, unknown if currently  breastfeeding. Maternal Exam:  Uterine Assessment: Contraction strength is moderate.  Contraction frequency is regular.   Abdomen: Patient reports no abdominal tenderness. Fetal presentation: vertex  Introitus: Normal vulva. Normal vagina.    Physical Exam  Constitutional: She appears well-developed.  Cardiovascular: Normal rate and regular rhythm.   Respiratory: Effort normal.  GI: Soft.  Genitourinary: Vagina normal.  Neurological: She is alert.  Psychiatric: She has a normal mood and affect.    Prenatal labs: ABO, Rh: --/--/A POS (11/13 0405) Antibody: NEG (11/13 0405) Rubella: Immune (05/20 0000) RPR: Nonreactive (05/20 0000)  HBsAg: Negative (05/20 0000)  HIV: Non-reactive (05/20 0000)  GBS: Negative (10/27 0000)  CF negative One hour GTT 141 3 hour GTT 86/179/158/150 Normal first trimester screen  Assessment/Plan: Pt admitted in labor, receiving epidural FHR Category 1   Carol Foster W 03/11/2014, 5:30 AM

## 2014-03-11 NOTE — Progress Notes (Signed)
Patient ID: Carol Foster, female   DOB: 04/18/1986, 28 y.o.   MRN: [id number] Cervix is fully dilated and she will begin pushing. FHT's are normal.

## 2014-03-11 NOTE — Plan of Care (Signed)
Problem: Phase I Progression Outcomes Goal: Pain controlled with appropriate interventions Outcome: Completed/Met Date Met:  03/11/14 Goal: Voiding adequately Outcome: Completed/Met Date Met:  03/11/14 Goal: OOB as tolerated unless otherwise ordered Outcome: Completed/Met Date Met:  03/11/14 Goal: VS, stable, temp < 100.4 degrees F Outcome: Completed/Met Date Met:  03/11/14 Goal: Initial discharge plan identified Outcome: Completed/Met Date Met:  03/11/14 Goal: Other Phase I Outcomes/Goals Outcome: Not Applicable Date Met:  83/66/29

## 2014-03-11 NOTE — Anesthesia Procedure Notes (Signed)
Epidural Patient location during procedure: OB Start time: 03/11/2014 5:05 AM  Staffing Anesthesiologist: Keyana Guevara A. Performed by: anesthesiologist   Preanesthetic Checklist Completed: patient identified, site marked, surgical consent, pre-op evaluation, timeout performed, IV checked, risks and benefits discussed and monitors and equipment checked  Epidural Patient position: sitting Prep: site prepped and draped and DuraPrep Patient monitoring: continuous pulse ox and blood pressure Approach: midline Location: L3-L4 Injection technique: LOR air  Needle:  Needle type: Tuohy  Needle gauge: 17 G Needle length: 9 cm and 9 Needle insertion depth: 5 cm cm Catheter type: closed end flexible Catheter size: 19 Gauge Catheter at skin depth: 10 cm Test dose: negative and Other  Assessment Events: blood not aspirated, injection not painful, no injection resistance, negative IV test and no paresthesia  Additional Notes Patient identified. Risks and benefits discussed including failed block, incomplete  Pain control, post dural puncture headache, nerve damage, paralysis, blood pressure Changes, nausea, vomiting, reactions to medications-both toxic and allergic and post Partum back pain. All questions were answered. Patient expressed understanding and wished to proceed. Sterile technique was used throughout procedure. Epidural site was Dressed with sterile barrier dressing. No paresthesias, signs of intravascular injection Or signs of intrathecal spread were encountered.  Patient was more comfortable after the epidural was dosed. Please see RN's note for documentation of vital signs and FHR which are stable.

## 2014-03-11 NOTE — Anesthesia Preprocedure Evaluation (Addendum)
Anesthesia Evaluation  Patient identified by MRN, date of birth, ID band Patient awake    Reviewed: Allergy & Precautions, H&P , Patient's Chart, lab work & pertinent test results  Airway Mallampati: II  TM Distance: >3 FB Neck ROM: Full    Dental no notable dental hx. (+) Teeth Intact   Pulmonary neg pulmonary ROS,  breath sounds clear to auscultation  Pulmonary exam normal       Cardiovascular negative cardio ROS  Rhythm:Regular Rate:Normal     Neuro/Psych negative neurological ROS  negative psych ROS   GI/Hepatic negative GI ROS, Neg liver ROS,   Endo/Other  diabetes, Well Controlled, Gestational, Oral Hypoglycemic AgentsPCOS   Renal/GU negative Renal ROS  negative genitourinary   Musculoskeletal negative musculoskeletal ROS (+)   Abdominal   Peds  Hematology  (+) anemia ,   Anesthesia Other Findings   Reproductive/Obstetrics (+) Pregnancy                            Anesthesia Physical Anesthesia Plan  ASA: II  Anesthesia Plan: Epidural   Post-op Pain Management:    Induction:   Airway Management Planned: Natural Airway  Additional Equipment:   Intra-op Plan:   Post-operative Plan:   Informed Consent: I have reviewed the patients History and Physical, chart, labs and discussed the procedure including the risks, benefits and alternatives for the proposed anesthesia with the patient or authorized representative who has indicated his/her understanding and acceptance.     Plan Discussed with: Anesthesiologist  Anesthesia Plan Comments:         Anesthesia Quick Evaluation

## 2014-03-11 NOTE — MAU Note (Signed)
Contractions, denies bleeding or ROM 

## 2014-03-11 NOTE — Plan of Care (Signed)
Problem: Phase II Progression Outcomes Goal: Pain controlled on oral analgesia Outcome: Completed/Met Date Met:  03/11/14 Goal: Progress activity as tolerated unless otherwise ordered Outcome: Completed/Met Date Met:  03/11/14 Goal: Afebrile, VS remain stable Outcome: Completed/Met Date Met:  03/11/14 Goal: Tolerating diet Outcome: Completed/Met Date Met:  03/11/14 Goal: Other Phase II Outcomes/Goals Outcome: Not Applicable Date Met:  10/81/06

## 2014-03-12 LAB — CBC
HCT: 30.7 % — ABNORMAL LOW (ref 36.0–46.0)
HEMOGLOBIN: 10.4 g/dL — AB (ref 12.0–15.0)
MCH: 30.7 pg (ref 26.0–34.0)
MCHC: 33.9 g/dL (ref 30.0–36.0)
MCV: 90.6 fL (ref 78.0–100.0)
Platelets: 141 10*3/uL — ABNORMAL LOW (ref 150–400)
RBC: 3.39 MIL/uL — AB (ref 3.87–5.11)
RDW: 14.1 % (ref 11.5–15.5)
WBC: 10.6 10*3/uL — ABNORMAL HIGH (ref 4.0–10.5)

## 2014-03-12 LAB — GLUCOSE, CAPILLARY: Glucose-Capillary: 129 mg/dL — ABNORMAL HIGH (ref 70–99)

## 2014-03-12 MED ORDER — IBUPROFEN 600 MG PO TABS: 600.0000 mg | ORAL_TABLET | Freq: Four times a day (QID) | ORAL | Status: DC | PRN

## 2014-03-12 NOTE — Discharge Instructions (Signed)
booklet °

## 2014-03-12 NOTE — Discharge Summary (Signed)
NAMReginal Lutes:  Foster, Carol             ACCOUNT NO.:  [id number]  MEDICAL RECORD NO.:  [id number]  LOCATION:  9115                          FACILITY:  [facility]  PHYSICIAN:  Malachi Prohomas F. Ambrose MantleHenley, M.D. DATE OF BIRTH:  11-02-1985  DATE OF ADMISSION:  03/11/2014 DATE OF DISCHARGE:  03/12/2014                              DISCHARGE SUMMARY   HOSPITAL COURSE:  A 28 year old white female, para 1-0-0-1, gravida 2[facility], presented for labor and the cervix changed to 5 cm.  Prenatal course was complicated by gestational diabetes, controlled on 2.5 mg every morning and 1.25 mg every night. She had been monitored with twice weekly nonstress tests since 32 weeks. After admission to the hospital, the patient was observed, and at 6:43 a.m., cervix was 7-8 cm.  Artificial rupture of the membranes produced clear fluid.  The patient then progressed to full dilatation at 7:39 a.m.  She pushed well and delivered a living female infant spontaneously LOA over an intact perineum.  The baby was rotated ROT to facilitate delivery.  Apgars were 8 and 9 at 1 and 5 minutes.  Placenta was delivered intact.  Uterus was normal.  Blood loss was about 400 mL. Postpartum, the patient did well.  She requested early discharge and is discharged on the first postpartum day.  Initial hemoglobin 11.4, hematocrit 34, white count 11,100, followup hemoglobin 10.4, platelet counts of 169 and 141.  FINAL DIAGNOSES:  Intrauterine pregnancy at 38+ weeks, delivered LOA. Gestational diabetes, on glyburide.  Postpartum, the patient had capillary blood glucoses of 99, 131, and 126 one hour after meals.  DISCHARGE INSTRUCTIONS:  Our regular discharge instruction booklet as well as the after visit summary.  She is advised to return to the office in 6 weeks for followup examination.  Call with any problems.  Motrin 600 mg 30 tablets, 1 every 6 hours as needed for pain is given at discharge.     Malachi Prohomas  F. Ambrose MantleHenley, M.D.     TFH/MEDQ  D:  03/12/2014  T:  03/12/2014  Job:  [id number]

## 2014-03-12 NOTE — Plan of Care (Signed)
Problem: Consults Goal: Postpartum Patient Education (See Patient Education module for education specifics.)  Outcome: Completed/Met Date Met:  03/12/14  Problem: Discharge Progression Outcomes Goal: Barriers To Progression Addressed/Resolved Outcome: Completed/Met Date Met:  03/12/14 Goal: Activity appropriate for discharge plan Outcome: Completed/Met Date Met:  03/12/14 Goal: Tolerating diet Outcome: Completed/Met Date Met:  16/10/96 Goal: Complications resolved/controlled Outcome: Completed/Met Date Met:  03/12/14 Goal: Pain controlled with appropriate interventions Outcome: Completed/Met Date Met:  03/12/14 Goal: Afebrile, VS remain stable at discharge Outcome: Completed/Met Date Met:  03/12/14 Goal: Discharge plan in place and appropriate Outcome: Completed/Met Date Met:  03/12/14 Goal: Other Discharge Outcomes/Goals Outcome: Not Applicable Date Met:  04/54/09

## 2014-03-12 NOTE — Lactation Note (Signed)
This note was copied from the chart of Carol Barbaraann Rondoicole Champine. Lactation Consultation Note Mom has early d/c home. States BF going great. Adequate I&O for hours of age. Mom is borrowing a medial pump from a friend. Encouraged to post-pump to stimulate milk supply. Reviewed supply and demand. Mom staes that she will BF at least 6 weeks but maybe longer. She had company in rm. So I was limited on talking about PCOS, but discussing pumping mom stated the other LC talked bout the pumping need. Encouraged for follow up w/LC after d/c home to see how supply is doing. States she has our information.  Patient Name: Carol Foster ZOXWR'UToday's Date: 03/12/2014 Reason for consult: Follow-up assessment   Maternal Data    Feeding Feeding Type: Breast Fed  LATCH Score/Interventions Latch: Grasps breast easily, tongue down, lips flanged, rhythmical sucking. Intervention(s): Adjust position  Audible Swallowing: A few with stimulation Intervention(s): Skin to skin  Type of Nipple: Everted at rest and after stimulation  Comfort (Breast/Nipple): Soft / non-tender     Hold (Positioning): Assistance needed to correctly position infant at breast and maintain latch.  LATCH Score: 8  Lactation Tools Discussed/Used     Consult Status Consult Status: Complete Date: 03/12/14 Follow-up type: Call as needed    Shantrell Placzek, Diamond NickelLAURA G 03/12/2014, 1:15 PM

## 2014-03-12 NOTE — Plan of Care (Signed)
Problem: Consults Goal: Postpartum Patient Education (See Patient Education module for education specifics.)  Outcome: Progressing     

## 2014-03-12 NOTE — Progress Notes (Signed)
Patient ID: Carol Foster, female   DOB: [birth year], 28 y.o.   MRN: [id number] #1 afebrile BP normal HGB stable requests d/c

## 2016-01-08 IMAGING — CR DG CHEST 2V
1 series · 2 of 2 positions shown · non-contrast
Comparison: None

CLINICAL DATA: Shortness of breath and chest pain.

EXAM:
CHEST - 2 VIEW

[Series 1: pa · 0.17mm/px · 2 of 2 slices shown]
[im 1/2]
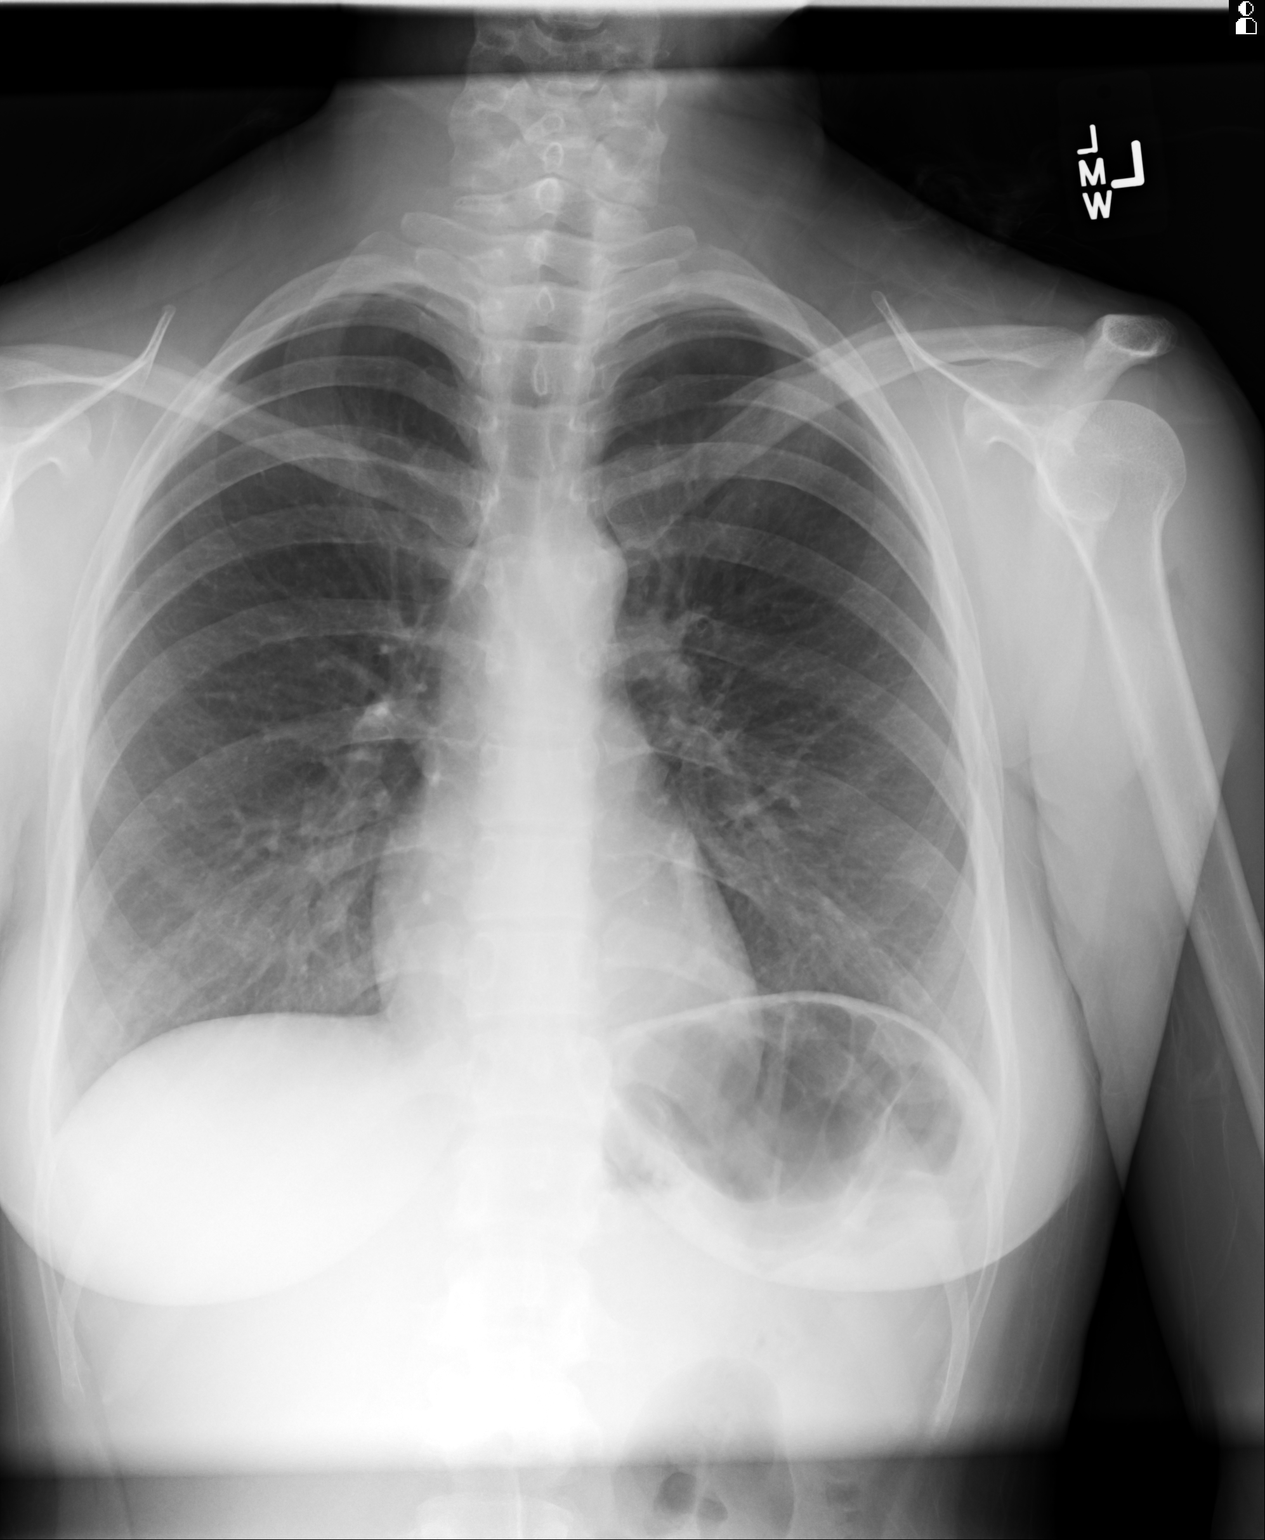
[im 2/2]
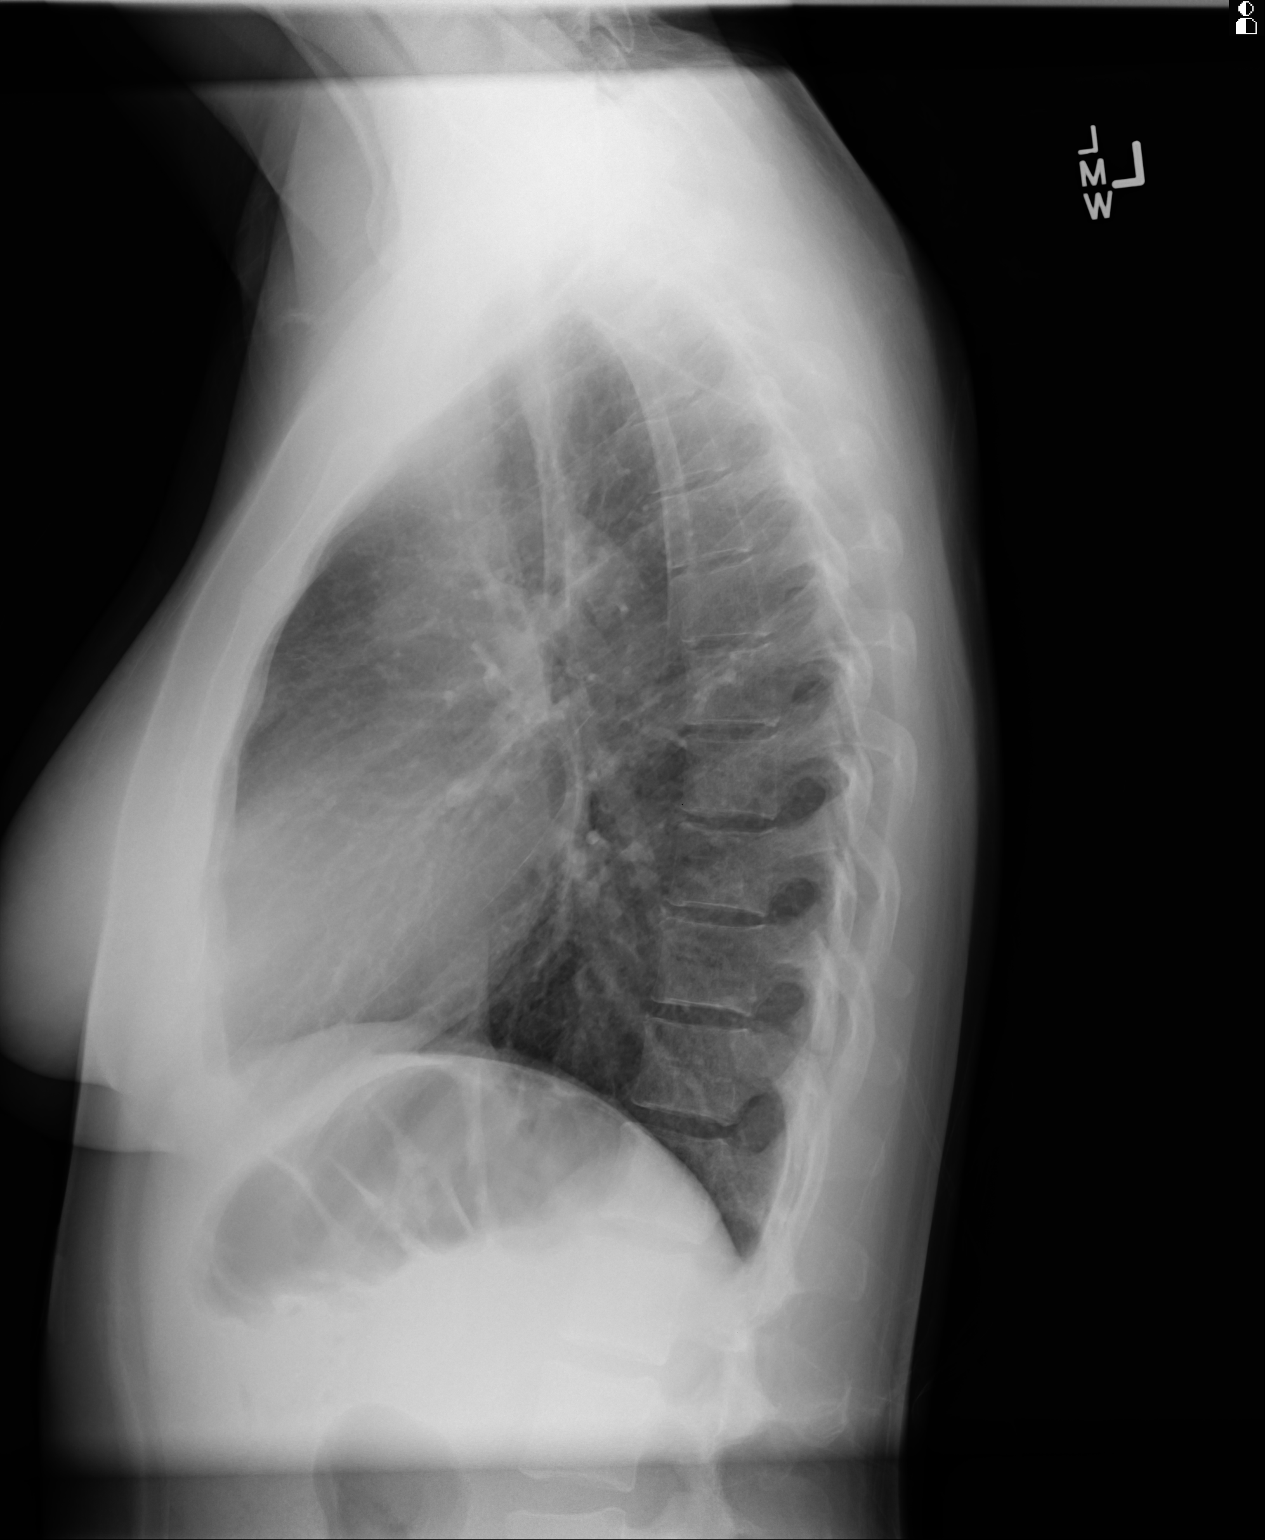

[2 of 2 positions shown; findings below may reference images not displayed]

FINDINGS: The heart size and mediastinal contours are within normal limits.
There is no evidence of pulmonary edema, consolidation,
pneumothorax, nodule or pleural fluid. The visualized skeletal
structures are unremarkable.
IMPRESSION: No active disease.

## 2016-05-30 ENCOUNTER — Ambulatory Visit (INDEPENDENT_AMBULATORY_CARE_PROVIDER_SITE_OTHER): Payer: BLUE CROSS/BLUE SHIELD | Admitting: Internal Medicine

## 2016-05-30 ENCOUNTER — Encounter: Payer: Self-pay | Admitting: Internal Medicine

## 2016-05-30 VITALS — BP 102/72 | HR 81 | Temp 98.1°F | Ht 63.0 in | Wt 129.0 lb

## 2016-05-30 DIAGNOSIS — K648 Other hemorrhoids: Secondary | ICD-10-CM | POA: Diagnosis not present

## 2016-05-30 DIAGNOSIS — R519 Headache, unspecified: Secondary | ICD-10-CM | POA: Insufficient documentation

## 2016-05-30 DIAGNOSIS — N3945 Continuous leakage: Secondary | ICD-10-CM | POA: Diagnosis not present

## 2016-05-30 DIAGNOSIS — R51 Headache: Secondary | ICD-10-CM | POA: Diagnosis not present

## 2016-05-30 DIAGNOSIS — E282 Polycystic ovarian syndrome: Secondary | ICD-10-CM | POA: Diagnosis not present

## 2016-05-30 MED ORDER — HYDROCORTISONE ACETATE 25 MG RE SUPP: 25.0000 mg | Freq: Two times a day (BID) | RECTAL | 0 refills | Status: DC

## 2016-05-30 NOTE — Assessment & Plan Note (Signed)
Sound tension related She will continue Advil for now She will let me know if these get worse Will monitor for now

## 2016-05-30 NOTE — Patient Instructions (Signed)
Kegel Exercises  The goal of Kegel exercises is to isolate and exercise your pelvic floor muscles. These muscles act as a hammock that supports the rectum, vagina, small intestine, and uterus. As the muscles weaken, the hammock sags and these organs are displaced from their normal positions. Kegel exercises can strengthen your pelvic floor muscles and help you to improve bladder and bowel control, improve sexual response, and help reduce many problems and some discomfort during pregnancy. Kegel exercises can be done anywhere and at any time.  HOW TO PERFORM KEGEL EXERCISES  1. Locate your pelvic floor muscles. To do this, squeeze (contract) the muscles that you use when you try to stop the flow of urine. You will feel a tightness in the vaginal area (women) and a tight lift in the rectal area (men and women).  2. When you begin, contract your pelvic muscles tight for 2-5 seconds, then relax them for 2-5 seconds. This is one set. Do 4-5 sets with a short pause in between.  3. Contract your pelvic muscles for 8-10 seconds, then relax them for 8-10 seconds. Do 4-5 sets. If you cannot contract your pelvic muscles for 8-10 seconds, try 5-7 seconds and work your way up to 8-10 seconds. Your goal is 4-5 sets of 10 contractions each day.  Keep your stomach, buttocks, and legs relaxed during the exercises. Perform sets of both short and long contractions. Vary your positions. Perform these contractions 3-4 times per day. Perform sets while you are:    · Lying in bed in the morning.  · Standing at lunch.  · Sitting in the late afternoon.  · Lying in bed at night.   You should do 40-50 contractions per day. Do not perform more Kegel exercises per day than recommended. Overexercising can cause muscle fatigue. Continue these exercises for for at least 15-20 weeks or as directed by your caregiver.     This information is not intended to replace advice given to you by your  health care provider. Make sure you discuss any questions you have with your health care provider.     Document Released: 04/01/2012 Document Revised: 05/06/2014 Document Reviewed: 03/05/2015  Elsevier Interactive Patient Education ©2017 [employer]

## 2016-05-30 NOTE — Assessment & Plan Note (Signed)
She will continue to follow with GYN 

## 2016-05-30 NOTE — Progress Notes (Signed)
HPI  Pt presents to the clinic today to establish care. She has not had a PCP in many years. She has been follow with [facility]  Gestational Diabetes: She was treated for this during pregnancy. She had her A1C checked in the postpartum period and it was normal.   Migraines: These occur every few months. They are located in her forehead. She describes the pain as pressure. She denies visual changes or dizziness. She reports associated sensitivity to light and sound. She does have nausea and vomiting. She usually feels better after vomiting. She takes Advil as needed with good relief.   PCOS: Managed with the Mirena IUD. She follows with GYN for this.  She is also concerned about rectal pain with having a bowel movement. She denies anal itching. She reports intermittent blood in her stool and when wiping. She does not feel constipated or feel like she has hard stools. She has tried Preparation H OTC with minimal relief.  She also reports urinary incontinence. She reports she leaks all the time. It is not associated with coughing, sneezing or laughing. It is not associated with urgency and just not being able to get to the bathroom in time. She has not tried anything OTC for this.   Flu: 01/2015 Tetanus: 12/2010 Pap Smear: 11/2015 Mammogram: 11/2015, family history of breast cancer at age 33 Vision Screening as needed Dentist: as needed  Past Medical History:  Diagnosis Date  . Gestational diabetes mellitus, antepartum   . History of chicken pox   . Hx of migraines   . Normal pregnancy, first 01/11/2011  . Polycystic ovarian syndrome   . SVD (spontaneous vaginal delivery) 01/11/2011    No current outpatient prescriptions on file.   No current facility-administered medications for this visit.     No Known Allergies  Family History  Problem Relation Age of Onset  . Breast cancer Mother   . Diabetes Paternal Uncle   . Alzheimer's disease Maternal Grandfather     Social History    Social History  . Marital status: Married    Spouse name: N/A  . Number of children: N/A  . Years of education: N/A   Occupational History  . Not on file.   Social History Main Topics  . Smoking status: Never Smoker  . Smokeless tobacco: Never Used  . Alcohol use Yes     Comment: rare  . Drug use: No  . Sexual activity: Yes    Birth control/ protection: None   Other Topics Concern  . Not on file   Social History Narrative  . No narrative on file    ROS:  Constitutional: Denies fever, malaise, fatigue, headache or abrupt weight changes.  HEENT: Denies eye pain, eye redness, ear pain, ringing in the ears, wax buildup, runny nose, nasal congestion, bloody nose, or sore throat. Respiratory: Denies difficulty breathing, shortness of breath, cough or sputum production.   Cardiovascular: Denies chest pain, chest tightness, palpitations or swelling in the hands or feet.  Gastrointestinal: Pt reports rectal pain and blood in stool. Denies abdominal pain, bloating, constipation, diarrhea.  GU: Pt reports urinary incontinence. Denies frequency, urgency, pain with urination, blood in urine, odor or discharge. Musculoskeletal: Denies decrease in range of motion, difficulty with gait, muscle pain or joint pain and swelling.  Skin: Denies redness, rashes, lesions or ulcercations.  Neurological: Denies dizziness, difficulty with memory, difficulty with speech or problems with balance and coordination.  Psych: Denies anxiety, depression, SI/HI.  No other specific complaints  in a complete review of systems (except as listed in HPI above).  PE:  BP 102/72   Pulse 81   Temp 98.1 F (36.7 C) (Oral)   Ht 5\' 3"  (1.6 m)   Wt 129 lb (58.5 kg)   SpO2 98%   BMI 22.85 kg/m  Wt Readings from Last 3 Encounters:  05/30/16 129 lb (58.5 kg)  03/11/14 141 lb (64 kg)  01/20/14 131 lb (59.4 kg)    General: Appears her stated age, well developed, well nourished in NAD. Skin: Dry and  intact. Cardiovascular: Normal rate and rhythm.  Pulmonary/Chest: Normal effort and positive vesicular breath sounds. No respiratory distress. No wheezes, rales or ronchi noted.  Abdomen: Soft and nontender. No distention or masses noted.  Rectal: No external mass noted. Normal rectal tone. No noticeable blood in stool.  Neurological: Alert and oriented.  Psychiatric: Mood and affect normal. Behavior is normal. Judgment and thought content normal.    BMET    Component Value Date/Time   NA 135 (L) 08/12/2013 1659   K 3.4 (L) 08/12/2013 1659   CL 104 08/12/2013 1659   CO2 26 08/12/2013 1659   GLUCOSE 91 08/12/2013 1659   BUN 7 08/12/2013 1659   CREATININE 0.57 (L) 08/12/2013 1659   CALCIUM 8.6 08/12/2013 1659   GFRNONAA >60 08/12/2013 1659   GFRAA >60 08/12/2013 1659    Lipid Panel  No results found for: CHOL, TRIG, HDL, CHOLHDL, VLDL, LDLCALC  CBC    Component Value Date/Time   WBC 10.6 (H) 03/12/2014 0604   RBC 3.39 (L) 03/12/2014 0604   HGB 10.4 (L) 03/12/2014 0604   HGB 13.5 08/12/2013 1659   HCT 30.7 (L) 03/12/2014 0604   HCT 38.9 08/12/2013 1659   PLT 141 (L) 03/12/2014 0604   PLT 183 08/12/2013 1659   MCV 90.6 03/12/2014 0604   MCV 91 08/12/2013 1659   MCH 30.7 03/12/2014 0604   MCHC 33.9 03/12/2014 0604   RDW 14.1 03/12/2014 0604   RDW 13.8 08/12/2013 1659    Hgb A1C No results found for: HGBA1C   Assessment and Plan:  Internal Hemorrhoids:  Discussed drinking plenty of water Advised her to take a stool softener as needed eRx for Anusol HC BD x 6 days If no improvement, we can refer to GI for further evaluation  Urinary Incontinence:  Discussed trying Kegel exercises as first step- handout given.  Discussed the use of anticholinergics, but hold off for now  Make an appt for your annual exam Nicki Reaper, NP

## 2016-09-02 ENCOUNTER — Encounter: Payer: Self-pay | Admitting: Internal Medicine

## 2016-09-02 ENCOUNTER — Ambulatory Visit (INDEPENDENT_AMBULATORY_CARE_PROVIDER_SITE_OTHER): Payer: BLUE CROSS/BLUE SHIELD | Admitting: Internal Medicine

## 2016-09-02 VITALS — BP 106/70 | HR 76 | Temp 98.1°F | Wt 128.0 lb

## 2016-09-02 DIAGNOSIS — R51 Headache: Secondary | ICD-10-CM

## 2016-09-02 DIAGNOSIS — R5383 Other fatigue: Secondary | ICD-10-CM | POA: Diagnosis not present

## 2016-09-02 DIAGNOSIS — R519 Headache, unspecified: Secondary | ICD-10-CM

## 2016-09-02 LAB — TSH: TSH: 1.14 u[IU]/mL (ref 0.35–4.50)

## 2016-09-02 LAB — COMPREHENSIVE METABOLIC PANEL
ALBUMIN: 4.6 g/dL (ref 3.5–5.2)
ALT: 34 U/L (ref 0–35)
AST: 25 U/L (ref 0–37)
Alkaline Phosphatase: 54 U/L (ref 39–117)
BUN: 12 mg/dL (ref 6–23)
CHLORIDE: 104 meq/L (ref 96–112)
CO2: 29 mEq/L (ref 19–32)
CREATININE: 0.74 mg/dL (ref 0.40–1.20)
Calcium: 9.6 mg/dL (ref 8.4–10.5)
GFR: 95.88 mL/min (ref >60.00)
Glucose, Bld: 91 mg/dL (ref 70–99)
Potassium: 4 mEq/L (ref 3.5–5.1)
SODIUM: 139 meq/L (ref 135–145)
Total Bilirubin: 0.4 mg/dL (ref 0.2–1.2)
Total Protein: 7.2 g/dL (ref 6.0–8.3)

## 2016-09-02 LAB — CBC
HCT: 41 % (ref 36.0–46.0)
Hemoglobin: 13.9 g/dL (ref 12.0–15.0)
MCHC: 33.8 g/dL (ref 30.0–36.0)
MCV: 90.1 fl (ref 78.0–100.0)
Platelets: 160 10*3/uL (ref 150.0–400.0)
RBC: 4.55 Mil/uL (ref 3.87–5.11)
RDW: 13 % (ref 11.5–15.5)
WBC: 4.9 10*3/uL (ref 4.0–10.5)

## 2016-09-02 LAB — VITAMIN B12: Vitamin B-12: 367 pg/mL (ref 211–911)

## 2016-09-02 LAB — VITAMIN D 25 HYDROXY (VIT D DEFICIENCY, FRACTURES): VITD: 40.2 ng/mL (ref 30.00–100.00)

## 2016-09-02 NOTE — Progress Notes (Signed)
Subjective:    Patient ID: Carol Foster, female    DOB: Dec 31, 1985, 31 y.o.   MRN: [id number]  HPI  Pt presents to the clinic today with c/o fatigue and headaches. She noticed this about 2 months ago. She reports she feels "drained and tired" all the time. She is getting at least 8-9 hours of sleep at night. She does occasionally have broken sleep due to having to use the bathroom or hearing noises. She does not feel rested when she wakes up. Her husband reports that she snores but has not noticed that she stops breathing. Her headaches are occurring 2-3 x a week. She wakes up with the headache and it gets better throughout the day. She reports these are not like her usual headaches. The headache is located in her temples. She denies sensitivity to light, sound, nausea or vomiting. She takes Advil with good relief. She is sexually active, but she does have a Mirena. She denies changes in diet, activity level or medications. She denies increased stress . She denies anxiety or depression.  Review of Systems      Past Medical History:  Diagnosis Date  . Gestational diabetes mellitus, antepartum   . History of chicken pox   . Hx of migraines   . Normal pregnancy, first 01/11/2011  . Polycystic ovarian syndrome   . SVD (spontaneous vaginal delivery) 01/11/2011    No current outpatient prescriptions on file.   No current facility-administered medications for this visit.     No Known Allergies  Family History  Problem Relation Age of Onset  . Breast cancer Mother   . Diabetes Paternal Uncle   . Alzheimer's disease Maternal Grandfather     Social History   Social History  . Marital status: Married    Spouse name: N/A  . Number of children: N/A  . Years of education: N/A   Occupational History  . Not on file.   Social History Main Topics  . Smoking status: Never Smoker  . Smokeless tobacco: Never Used  . Alcohol use Yes     Comment: rare  . Drug use: No  . Sexual  activity: Yes    Birth control/ protection: IUD   Other Topics Concern  . Not on file   Social History Narrative  . No narrative on file     Constitutional: Pt reports headaches and fatigue. Denies fever, malaise, or abrupt weight changes.  HEENT: Denies eye pain, eye redness, ear pain, ringing in the ears, wax buildup, runny nose, nasal congestion, bloody nose, or sore throat. Respiratory: Denies difficulty breathing, shortness of breath, cough or sputum production.   Cardiovascular: Denies chest pain, chest tightness, palpitations or swelling in the hands or feet.  Gastrointestinal: Denies abdominal pain, bloating, constipation, diarrhea or blood in the stool.  GU: Denies urgency, frequency, pain with urination, burning sensation, blood in urine, odor or discharge. Musculoskeletal: Denies decrease in range of motion, difficulty with gait, muscle pain or joint pain and swelling.  Skin: Denies redness, rashes, lesions or ulcercations.  Neurological: Denies dizziness, difficulty with memory, difficulty with speech or problems with balance and coordination.  Psych: Denies anxiety, depression, SI/HI.  No other specific complaints in a complete review of systems (except as listed in HPI above).  Objective:   Physical Exam  BP 106/70   Pulse 76   Temp 98.1 F (36.7 C) (Oral)   Wt 128 lb (58.1 kg)   SpO2 99%   BMI 22.67 kg/m  Wt  Readings from Last 3 Encounters:  09/02/16 128 lb (58.1 kg)  05/30/16 129 lb (58.5 kg)  03/11/14 141 lb (64 kg)    General: Appears her stated age, in NAD. Skin: Warm, dry and intact. No rashes noted. Neck:  Neck supple, trachea midline. No masses, lumps or thyromegaly present.  Cardiovascular: Normal rate and rhythm. S1,S2 noted.  No murmur, rubs or gallops noted. No JVD or BLE edema. No carotid bruits noted. Pulmonary/Chest: Normal effort and positive vesicular breath sounds. No respiratory distress. No wheezes, rales or ronchi noted.  Neurological:  Alert and oriented.    BMET    Component Value Date/Time   NA 135 (L) 08/12/2013 1659   K 3.4 (L) 08/12/2013 1659   CL 104 08/12/2013 1659   CO2 26 08/12/2013 1659   GLUCOSE 91 08/12/2013 1659   BUN 7 08/12/2013 1659   CREATININE 0.57 (L) 08/12/2013 1659   CALCIUM 8.6 08/12/2013 1659   GFRNONAA >60 08/12/2013 1659   GFRAA >60 08/12/2013 1659    Lipid Panel  No results found for: CHOL, TRIG, HDL, CHOLHDL, VLDL, LDLCALC  CBC    Component Value Date/Time   WBC 10.6 (H) 03/12/2014 0604   RBC 3.39 (L) 03/12/2014 0604   HGB 10.4 (L) 03/12/2014 0604   HGB 13.5 08/12/2013 1659   HCT 30.7 (L) 03/12/2014 0604   HCT 38.9 08/12/2013 1659   PLT 141 (L) 03/12/2014 0604   PLT 183 08/12/2013 1659   MCV 90.6 03/12/2014 0604   MCV 91 08/12/2013 1659   MCH 30.7 03/12/2014 0604   MCHC 33.9 03/12/2014 0604   RDW 14.1 03/12/2014 0604   RDW 13.8 08/12/2013 1659    Hgb A1C No results found for: HGBA1C          Assessment & Plan:   Fatigue, Headaches:  ? Due to lack of REM sleep Will check CBC, CMET, TSH, Vit D and B12 today If labs normal, consider a sleep study Discussed healthy diet and exercise regimens to help boost energy  Will follow up after labs, RTC as needed Nicki Reaper, NP

## 2016-09-02 NOTE — Patient Instructions (Signed)

## 2016-09-03 ENCOUNTER — Other Ambulatory Visit: Payer: Self-pay | Admitting: Internal Medicine

## 2016-09-03 DIAGNOSIS — G4719 Other hypersomnia: Secondary | ICD-10-CM

## 2016-10-21 ENCOUNTER — Institutional Professional Consult (permissible substitution): Payer: BLUE CROSS/BLUE SHIELD | Admitting: Internal Medicine

## 2016-12-02 ENCOUNTER — Institutional Professional Consult (permissible substitution): Payer: BLUE CROSS/BLUE SHIELD | Admitting: Internal Medicine

## 2016-12-03 ENCOUNTER — Encounter: Payer: Self-pay | Admitting: Internal Medicine

## 2017-01-16 ENCOUNTER — Encounter: Payer: Self-pay | Admitting: Internal Medicine

## 2017-01-16 ENCOUNTER — Ambulatory Visit (INDEPENDENT_AMBULATORY_CARE_PROVIDER_SITE_OTHER): Payer: BLUE CROSS/BLUE SHIELD | Admitting: Internal Medicine

## 2017-01-16 VITALS — BP 106/74 | HR 76 | Temp 97.9°F | Wt 123.8 lb

## 2017-01-16 DIAGNOSIS — R519 Headache, unspecified: Secondary | ICD-10-CM

## 2017-01-16 DIAGNOSIS — R51 Headache: Secondary | ICD-10-CM | POA: Diagnosis not present

## 2017-01-16 MED ORDER — METHYLPREDNISOLONE ACETATE 80 MG/ML IJ SUSP
80.0000 mg | Freq: Once | INTRAMUSCULAR | Status: AC
  Administered 2017-01-16: 80 mg via INTRAMUSCULAR

## 2017-01-16 MED ORDER — BUTALBITAL-APAP-CAFFEINE 50-325-40 MG PO TABS: 1.0000 | ORAL_TABLET | Freq: Four times a day (QID) | ORAL | 0 refills | Status: AC | PRN

## 2017-01-16 NOTE — Addendum Note (Signed)
Addended by: Roena Malady on: 01/16/2017 10:07 AM   Modules accepted: Orders

## 2017-01-16 NOTE — Patient Instructions (Signed)

## 2017-01-16 NOTE — Progress Notes (Signed)
Subjective:    Patient ID: Carol Foster, female    DOB: 03-07-1986, 31 y.o.   MRN: [id number]  HPI  Pt presents to the clinic today with c/o a headache. She reports this started 4-5 ays ago. The headache is located in her forehead. She describes the pain as pressure. She reports associated nausea and dizziness. She describes the dizziness as a sensation of the room spinning. It is worse when she closes her eyes. The nausea is intermittent. She denies visual changes or sensitivity to light or sound. She denies any injury to the head or neck. She has tried Advil Migraine without any relief.  She has had frequent headaches in the past.  Review of Systems  Past Medical History:  Diagnosis Date  . Gestational diabetes mellitus, antepartum   . History of chicken pox   . Hx of migraines   . Normal pregnancy, first 01/11/2011  . Polycystic ovarian syndrome   . SVD (spontaneous vaginal delivery) 01/11/2011    No current outpatient prescriptions on file.   No current facility-administered medications for this visit.     No Known Allergies  Family History  Problem Relation Age of Onset  . Breast cancer Mother   . Diabetes Paternal Uncle   . Alzheimer's disease Maternal Grandfather     Social History   Social History  . Marital status: Married    Spouse name: N/A  . Number of children: N/A  . Years of education: N/A   Occupational History  . Not on file.   Social History Main Topics  . Smoking status: Never Smoker  . Smokeless tobacco: Never Used  . Alcohol use Yes     Comment: rare  . Drug use: No  . Sexual activity: Yes    Birth control/ protection: IUD   Other Topics Concern  . Not on file   Social History Narrative  . No narrative on file     Constitutional: Pt reports headaches. Denies fever, malaise, fatigue, or abrupt weight changes.  Gastrointestinal: Pt reports nausea. Denies abdominal pain, bloating, constipation, diarrhea or blood in the stool.    Neurological: Pt reports dizziness. Denies difficulty with memory, difficulty with speech or problems with balance and coordination.    No other specific complaints in a complete review of systems (except as listed in HPI above).     Objective:   Physical Exam   BP 106/74   Pulse 76   Temp 97.9 F (36.6 C) (Oral)   Wt 123 lb 12 oz (56.1 kg)   SpO2 99%   BMI 21.92 kg/m  Wt Readings from Last 3 Encounters:  01/16/17 123 lb 12 oz (56.1 kg)  09/02/16 128 lb (58.1 kg)  05/30/16 129 lb (58.5 kg)    General: Appears her stated age, in NAD. HEENT: Head: normal shape and size, no frontal sinus tenderness noted; Eyes: PERRLA and EOMs intact; Abdomen: Soft and nontender. Normal bowel sounds.  Musculoskeletal: Normal flexion, extension and rotation of the cervical spine. No bony tenderness noted over the cervical spine. Neurological: Alert and oriented. Coordination normal.    BMET    Component Value Date/Time   NA 139 09/02/2016 1408   NA 135 (L) 08/12/2013 1659   K 4.0 09/02/2016 1408   K 3.4 (L) 08/12/2013 1659   CL 104 09/02/2016 1408   CL 104 08/12/2013 1659   CO2 29 09/02/2016 1408   CO2 26 08/12/2013 1659   GLUCOSE 91 09/02/2016 1408   GLUCOSE 91  08/12/2013 1659   BUN 12 09/02/2016 1408   BUN 7 08/12/2013 1659   CREATININE 0.74 09/02/2016 1408   CREATININE 0.57 (L) 08/12/2013 1659   CALCIUM 9.6 09/02/2016 1408   CALCIUM 8.6 08/12/2013 1659   GFRNONAA >60 08/12/2013 1659   GFRAA >60 08/12/2013 1659    Lipid Panel  No results found for: CHOL, TRIG, HDL, CHOLHDL, VLDL, LDLCALC  CBC    Component Value Date/Time   WBC 4.9 09/02/2016 1408   RBC 4.55 09/02/2016 1408   HGB 13.9 09/02/2016 1408   HGB 13.5 08/12/2013 1659   HCT 41.0 09/02/2016 1408   HCT 38.9 08/12/2013 1659   PLT 160.0 09/02/2016 1408   PLT 183 08/12/2013 1659   MCV 90.1 09/02/2016 1408   MCV 91 08/12/2013 1659   MCH 30.7 03/12/2014 0604   MCHC 33.8 09/02/2016 1408   RDW 13.0 09/02/2016 1408    RDW 13.8 08/12/2013 1659    Hgb A1C No results found for: HGBA1C         Assessment & Plan:   Headache:  80 mg Depo IM today RX for Fioricet provided today Push fluids, get some rest  Return precautions discussed Nicki Reaper, NP

## 2017-02-06 ENCOUNTER — Ambulatory Visit (INDEPENDENT_AMBULATORY_CARE_PROVIDER_SITE_OTHER): Payer: BLUE CROSS/BLUE SHIELD | Admitting: Family Medicine

## 2017-02-06 ENCOUNTER — Encounter: Payer: Self-pay | Admitting: Family Medicine

## 2017-02-06 ENCOUNTER — Ambulatory Visit (INDEPENDENT_AMBULATORY_CARE_PROVIDER_SITE_OTHER)
Admission: RE | Admit: 2017-02-06 | Discharge: 2017-02-06 | Disposition: A | Discharge status: Home / Self Care | Payer: BLUE CROSS/BLUE SHIELD | Source: Ambulatory Visit | Attending: Family Medicine | Admitting: Family Medicine

## 2017-02-06 VITALS — BP 114/78 | HR 71 | Temp 98.0°F | Ht 63.0 in | Wt 123.8 lb

## 2017-02-06 DIAGNOSIS — M79602 Pain in left arm: Secondary | ICD-10-CM

## 2017-02-06 DIAGNOSIS — M79601 Pain in right arm: Secondary | ICD-10-CM

## 2017-02-06 DIAGNOSIS — R519 Headache, unspecified: Secondary | ICD-10-CM

## 2017-02-06 DIAGNOSIS — R202 Paresthesia of skin: Secondary | ICD-10-CM | POA: Diagnosis not present

## 2017-02-06 DIAGNOSIS — R51 Headache: Secondary | ICD-10-CM

## 2017-02-06 MED ORDER — KETOROLAC TROMETHAMINE 60 MG/2ML IM SOLN
60.0000 mg | Freq: Once | INTRAMUSCULAR | Status: AC
  Administered 2017-02-06: 60 mg via INTRAMUSCULAR

## 2017-02-06 NOTE — Addendum Note (Signed)
Addended by: Lerry Liner on: 02/06/2017 09:42 AM   Modules accepted: Orders

## 2017-02-06 NOTE — Patient Instructions (Signed)
Great to see you.  We will call you with your xray results.   

## 2017-02-06 NOTE — Progress Notes (Signed)
Subjective:   Patient ID: Carol Foster, female    DOB: 1986-02-01, 31 y.o.   MRN: [id number]  Carol Foster is a pleasant 31 y.o. year old female who presents to clinic today with Headache (Patient is here today C/O Headache.  States it has been intermittent for 2 weeks.  The medication given at last visit helps to relieve it but it rebounds.  States that her arms hurt and sensation of hands burning.  Also feels as if she is in a tunnel moreso than a dizzy sensation during the headaches.)  on 02/06/2017  HPI:  Pt is new to me. Saw her PCP, Nicki Reaper for this complaint on 01/16/17.  Note reviewed.  [name] gave her 80 mg depo IM and rx for fioricet.  She is here today because HA has intermittently persisted for 2 weeks.  Fioricet helps but headaches return.  Feels she is in a tunnel, not really dizzy, while she having these headaches.   She is a migraine suffer but she feels these headaches are different.   Feels her hands are tingling and achy with these headaches.  No neck pain. Current Outpatient Prescriptions on File Prior to Visit  Medication Sig Dispense Refill  . butalbital-acetaminophen-caffeine (FIORICET, ESGIC) 50-325-40 MG tablet Take 1-2 tablets by mouth every 6 (six) hours as needed for headache. 30 tablet 0  . levonorgestrel (MIRENA, 52 MG,) 20 MCG/24HR IUD Mirena 20 mcg/24 hr (5 years) intrauterine device  Take by intrauterine route.     No current facility-administered medications on file prior to visit.     No Known Allergies  Past Medical History:  Diagnosis Date  . Gestational diabetes mellitus, antepartum   . History of chicken pox   . Hx of migraines   . Normal pregnancy, first 01/11/2011  . Polycystic ovarian syndrome   . SVD (spontaneous vaginal delivery) 01/11/2011    Past Surgical History:  Procedure Laterality Date  . TOOTH EXTRACTION      Family History  Problem Relation Age of Onset  . Breast cancer Mother   . Diabetes  Paternal Uncle   . Alzheimer's disease Maternal Grandfather     Social History   Social History  . Marital status: Married    Spouse name: N/A  . Number of children: N/A  . Years of education: N/A   Occupational History  . Not on file.   Social History Main Topics  . Smoking status: Never Smoker  . Smokeless tobacco: Never Used  . Alcohol use Yes     Comment: rare  . Drug use: No  . Sexual activity: Yes    Birth control/ protection: IUD   Other Topics Concern  . Not on file   Social History Narrative  . No narrative on file   The PMH, PSH, Social History, Family History, Medications, and allergies have been reviewed in [facility], and have been updated if relevant.   Review of Systems  Constitutional: Negative.   Respiratory: Negative.   Cardiovascular: Negative.   Musculoskeletal: Negative.   Neurological: Positive for numbness and headaches. Negative for dizziness, tremors, seizures, syncope, facial asymmetry, speech difficulty, weakness and light-headedness.  Hematological: Negative.   Psychiatric/Behavioral: Negative.   All other systems reviewed and are negative.      Objective:    BP 114/78 (BP Location: Left Arm, Patient Position: Sitting, Cuff Size: Normal)   Pulse 71   Temp 98 F (36.7 C) (Oral)   Ht  (1.6 m)  Wt 123 lb 12.8 oz (56.2 kg)   SpO2 97%   BMI 21.93 kg/m    Physical Exam   General:  Well-developed,well-nourished,in no acute distress; alert,appropriate and cooperative throughout examination Head:  normocephalic and atraumatic.   Eyes:  vision grossly intact, PERRL Ears:  R ear normal and L ear normal externally, TMs clear bilaterally Nose:  no external deformity.   Mouth:  good dentition.   Neck:  No deformities, masses, or tenderness noted. Lungs:  Normal respiratory effort, chest expands symmetrically. Lungs are clear to auscultation, no crackles or wheezes. Heart:  Normal rate and regular rhythm. S1 and S2 normal without  gallop, murmur, click, rub or other extra sounds. Msk:  No deformity or scoliosis noted of thoracic or lumbar spine.   Extremities:  No clubbing, cyanosis, edema, or deformity noted with normal full range of motion of all joints.   Neurologic:  alert & oriented X3 and gait normal.   Normal grip strength bilaterally Skin:  Intact without suspicious lesions or rashes Psych:  Cognition and judgment appear intact. Alert and cooperative with normal attention span and concentration. No apparent delusions, illusions, hallucinations       Assessment & Plan:   Frequent headaches - Plan: DG Cervical Spine Complete  Paresthesia and pain of both upper extremities - Plan: DG Cervical Spine Complete No Follow-up on file.

## 2017-02-06 NOTE — Assessment & Plan Note (Signed)
New- see above. 

## 2017-02-06 NOTE — Assessment & Plan Note (Signed)
Persistent. Unclear etiology at this point- given symptoms of radiculopathy, will check cervical neck film to rule out cervical cause.  IM toradol 60 mg IM now to break headache cycle.  Did discuss rebound and medication overuse headaches.

## 2017-02-10 ENCOUNTER — Telehealth: Payer: Self-pay | Admitting: Family Medicine

## 2017-02-10 NOTE — Telephone Encounter (Signed)
Patient called to get her x-ray results.  Patient said she doesn't use my chart. I took her off my chart.  Patient was given her x-ray results.  Patient said the shot did well for a day.  She has a headache again today.

## 2017-02-10 NOTE — Telephone Encounter (Signed)
I'm sorry to hear that.  Can she follow up with PCP this week?

## 2017-02-10 NOTE — Telephone Encounter (Signed)
Patient scheduled appointment with [name] for 02/11/17 at 8:00.

## 2017-02-11 ENCOUNTER — Encounter: Payer: Self-pay | Admitting: Internal Medicine

## 2017-02-11 ENCOUNTER — Ambulatory Visit (INDEPENDENT_AMBULATORY_CARE_PROVIDER_SITE_OTHER): Payer: BLUE CROSS/BLUE SHIELD | Admitting: Internal Medicine

## 2017-02-11 ENCOUNTER — Telehealth: Payer: Self-pay

## 2017-02-11 VITALS — BP 110/68 | HR 82 | Temp 97.8°F | Wt 122.0 lb

## 2017-02-11 DIAGNOSIS — R519 Headache, unspecified: Secondary | ICD-10-CM

## 2017-02-11 DIAGNOSIS — R51 Headache: Secondary | ICD-10-CM | POA: Diagnosis not present

## 2017-02-11 MED ORDER — PROPRANOLOL HCL ER 80 MG PO CP24: 80.0000 mg | ORAL_CAPSULE | Freq: Every day | ORAL | 0 refills | Status: DC

## 2017-02-11 NOTE — Telephone Encounter (Signed)
Pt left v/m late 02/10/17 and pt had life ins work up done and glucose was high. Pt wants to know if needs to have fasting labs.  I spoke with pt and she discussed with R Baity NP this morning at appt. Nothing further needed.

## 2017-02-11 NOTE — Progress Notes (Signed)
Subjective:    Patient ID: Carol Foster, female    DOB: 01-13-1986, 31 y.o.   MRN: [id number]  HPI  Pt presents to the clinic today with c/o persistent headaches. She reports this has been going on for months, and is not getting worse, but is also not improving. She reports the headaches are located in her forehead and temples. She describes the pain as tight and squeezing. She denies pain in her neck. She denies sinus complaints. She denies dizziness or visual changes but reports intermittent lightheadedness. She has been taking Fioricet with some relief, but reports once the medication wears off, the headache is right back. She has also tried Ibuprofen and Excedrin Migraine with minimal relief. She has never seen a neurologist or headache wellness center.  Review of Systems      Past Medical History:  Diagnosis Date  . Gestational diabetes mellitus, antepartum   . History of chicken pox   . Hx of migraines   . Normal pregnancy, first 01/11/2011  . Polycystic ovarian syndrome   . SVD (spontaneous vaginal delivery) 01/11/2011    Current Outpatient Prescriptions  Medication Sig Dispense Refill  . butalbital-acetaminophen-caffeine (FIORICET, ESGIC) 50-325-40 MG tablet Take 1-2 tablets by mouth every 6 (six) hours as needed for headache. 30 tablet 0  . levonorgestrel (MIRENA, 52 MG,) 20 MCG/24HR IUD Mirena 20 mcg/24 hr (5 years) intrauterine device  Take by intrauterine route.     No current facility-administered medications for this visit.     No Known Allergies  Family History  Problem Relation Age of Onset  . Breast cancer Mother   . Diabetes Paternal Uncle   . Alzheimer's disease Maternal Grandfather     Social History   Social History  . Marital status: Married    Spouse name: N/A  . Number of children: N/A  . Years of education: N/A   Occupational History  . Not on file.   Social History Main Topics  . Smoking status: Never Smoker  . Smokeless tobacco:  Never Used  . Alcohol use Yes     Comment: rare  . Drug use: No  . Sexual activity: Yes    Birth control/ protection: IUD   Other Topics Concern  . Not on file   Social History Narrative  . No narrative on file     Constitutional: Pt reports headaches. Denies fever, malaise, fatigue, or abrupt weight changes.  HEENT: Denies eye pain, eye redness, ear pain, ringing in the ears, wax buildup, runny nose, nasal congestion, bloody nose, or sore throat. Neurological: Denies dizziness, difficulty with memory, difficulty with speech or problems with balance and coordination.    No other specific complaints in a complete review of systems (except as listed in HPI above).  Objective:   Physical Exam  BP 110/68   Pulse 82   Temp 97.8 F (36.6 C) (Oral)   Wt 122 lb (55.3 kg)   SpO2 98%   BMI 21.61 kg/m  Wt Readings from Last 3 Encounters:  02/11/17 122 lb (55.3 kg)  02/06/17 123 lb 12.8 oz (56.2 kg)  01/16/17 123 lb 12 oz (56.1 kg)    General: Appears her stated age, well developed, well nourished in NAD. HEENT:Eyes:  PERRLA and EOMs intact;  Neurological: Alert and oriented. . Coordination normal.    BMET    Component Value Date/Time   NA 139 09/02/2016 1408   NA 135 (L) 08/12/2013 1659   K 4.0 09/02/2016 1408   K  3.4 (L) 08/12/2013 1659   CL 104 09/02/2016 1408   CL 104 08/12/2013 1659   CO2 29 09/02/2016 1408   CO2 26 08/12/2013 1659   GLUCOSE 91 09/02/2016 1408   GLUCOSE 91 08/12/2013 1659   BUN 12 09/02/2016 1408   BUN 7 08/12/2013 1659   CREATININE 0.74 09/02/2016 1408   CREATININE 0.57 (L) 08/12/2013 1659   CALCIUM 9.6 09/02/2016 1408   CALCIUM 8.6 08/12/2013 1659   GFRNONAA >60 08/12/2013 1659   GFRAA >60 08/12/2013 1659    Lipid Panel  No results found for: CHOL, TRIG, HDL, CHOLHDL, VLDL, LDLCALC  CBC    Component Value Date/Time   WBC 4.9 09/02/2016 1408   RBC 4.55 09/02/2016 1408   HGB 13.9 09/02/2016 1408   HGB 13.5 08/12/2013 1659   HCT  41.0 09/02/2016 1408   HCT 38.9 08/12/2013 1659   PLT 160.0 09/02/2016 1408   PLT 183 08/12/2013 1659   MCV 90.1 09/02/2016 1408   MCV 91 08/12/2013 1659   MCH 30.7 03/12/2014 0604   MCHC 33.8 09/02/2016 1408   RDW 13.0 09/02/2016 1408   RDW 13.8 08/12/2013 1659    Hgb A1C No results found for: HGBA1C          Assessment & Plan:   Frequent Headaches:  Discussed starting preventative therapy Will start with Propanolol 80 mg daily If no improvement, we can consider Topamax No red flags today that require CT scan at this time Consider referral to neurology or headache wellness center  RTC as needed or if symptoms persist or worsen Raygen Linquist, NP

## 2017-02-11 NOTE — Patient Instructions (Signed)

## 2017-03-05 ENCOUNTER — Encounter: Payer: BLUE CROSS/BLUE SHIELD | Admitting: Internal Medicine

## 2017-03-05 DIAGNOSIS — Z0289 Encounter for other administrative examinations: Secondary | ICD-10-CM

## 2017-03-05 NOTE — Progress Notes (Deleted)
   Subjective:    Patient ID: Carol Foster, female    DOB: [birth year], 31 y.o.   MRN: [id number]  HPI  Pt presents to the clinic today for her annual exam.  Flu: 02/2014 Tetanus: 12/2013 Pap Smear: Dentist:  Diet: Exercise:  Review of Systems  Past Medical History:  Diagnosis Date  . Gestational diabetes mellitus, antepartum   . History of chicken pox   . Hx of migraines   . Normal pregnancy, first 01/11/2011  . Polycystic ovarian syndrome   . SVD (spontaneous vaginal delivery) 01/11/2011    Current Outpatient Medications  Medication Sig Dispense Refill  . butalbital-acetaminophen-caffeine (FIORICET, ESGIC) 50-325-40 MG tablet Take 1-2 tablets by mouth every 6 (six) hours as needed for headache. 30 tablet 0  . levonorgestrel (MIRENA, 52 MG,) 20 MCG/24HR IUD Mirena 20 mcg/24 hr (5 years) intrauterine device  Take by intrauterine route.    . propranolol ER (INDERAL LA) 80 MG 24 hr capsule Take 1 capsule (80 mg total) by mouth daily. 30 capsule 0   No current facility-administered medications for this visit.     No Known Allergies  Family History  Problem Relation Age of Onset  . Breast cancer Mother   . Diabetes Paternal Uncle   . Alzheimer's disease Maternal Grandfather     Social History   Socioeconomic History  . Marital status: Married    Spouse name: Not on file  . Number of children: Not on file  . Years of education: Not on file  . Highest education level: Not on file  Social Needs  . Financial resource strain: Not on file  . Food insecurity - worry: Not on file  . Food insecurity - inability: Not on file  . Transportation needs - medical: Not on file  . Transportation needs - non-medical: Not on file  Occupational History  . Not on file  Tobacco Use  . Smoking status: Never Smoker  . Smokeless tobacco: Never Used  Substance and Sexual Activity  . Alcohol use: Yes    Comment: rare  . Drug use: No  . Sexual activity: Yes    Birth  control/protection: IUD  Other Topics Concern  . Not on file  Social History Narrative  . Not on file     Constitutional: Denies fever, malaise, fatigue, headache or abrupt weight changes.  HEENT: Denies eye pain, eye redness, ear pain, ringing in the ears, wax buildup, runny nose, nasal congestion, bloody nose, or sore throat. Respiratory: Denies difficulty breathing, shortness of breath, cough or sputum production.   Cardiovascular: Denies chest pain, chest tightness, palpitations or swelling in the hands or feet.  Gastrointestinal: Denies abdominal pain, bloating, constipation, diarrhea or blood in the stool.  GU: Denies urgency, frequency, pain with urination, burning sensation, blood in urine, odor or discharge. Musculoskeletal: Denies decrease in range of motion, difficulty with gait, muscle pain or joint pain and swelling.  Skin: Denies redness, rashes, lesions or ulcercations.  Neurological: Denies dizziness, difficulty with memory, difficulty with speech or problems with balance and coordination.  Psych: Denies anxiety, depression, SI/HI.  No other specific complaints in a complete review of systems (except as listed in HPI above).     Objective:   Physical Exam        Assessment & Plan:

## 2017-03-19 ENCOUNTER — Telehealth: Payer: Self-pay

## 2017-03-19 NOTE — Telephone Encounter (Signed)
Copied from CRM #[id number]. Topic: Inquiry >> Mar 19, 2017  3:30 PM Yvonna Alanisobinson, Andra M wrote: Reason for CRM: Patient called wanting to speak with Nicki Reaperegina Baity concerning her headaches. Patient [facility] a return [facility]. Thank You!!!  >> Mar 19, 2017  3:44 PM Patience MuscaIsley, Jehan Ranganathan M, LPN wrote: I tried to call pt and message pt is not available and try call later. Pt has appt with Pamala Hurry Baity NP on 03/24/17.

## 2017-03-24 ENCOUNTER — Ambulatory Visit: Payer: BLUE CROSS/BLUE SHIELD | Admitting: Internal Medicine

## 2017-03-24 ENCOUNTER — Encounter: Payer: Self-pay | Admitting: Internal Medicine

## 2017-03-24 VITALS — BP 116/62 | HR 85 | Temp 98.1°F | Wt 122.2 lb

## 2017-03-24 DIAGNOSIS — R51 Headache: Secondary | ICD-10-CM | POA: Diagnosis not present

## 2017-03-24 DIAGNOSIS — R519 Headache, unspecified: Secondary | ICD-10-CM

## 2017-03-24 MED ORDER — KETOROLAC TROMETHAMINE 30 MG/ML IJ SOLN
30.0000 mg | Freq: Once | INTRAMUSCULAR | Status: AC
Start: 2017-03-24 — End: 2017-03-24
  Administered 2017-03-24: 30 mg via INTRAMUSCULAR

## 2017-03-24 MED ORDER — SUMATRIPTAN SUCCINATE 25 MG PO TABS: 25.0000 mg | ORAL_TABLET | ORAL | 0 refills | Status: DC | PRN

## 2017-03-24 NOTE — Patient Instructions (Signed)

## 2017-03-24 NOTE — Progress Notes (Signed)
Subjective:    Patient ID: Carol Foster, female    DOB: [birth year], 31 y.o.   MRN: [id number]  HPI  Pt presents to the clinic today to follow up headaches. She has been seen 05/30/2016, 09/02/2016, 01/16/2017, 02/06/2017 and 02/12/2015 for the same. She was most recently started on Propanolol for migraine prevention and Fioricet for abortive therapy. She felt like the Propanolol was effective at first, but now feels like her headaches are worse. The headaches are all over her head. She reports they hurt so bad, she has to stay in bed all day. She has nausea but denies vomiting. She reports the Fioricet is ineffective. She has had a normal cervical xray 02/06/2017. She has no idea what is triggering her headaches. She is requesting a referral to neurology at this time.  Review of Systems  Past Medical History:  Diagnosis Date  . Gestational diabetes mellitus, antepartum   . History of chicken pox   . Hx of migraines   . Normal pregnancy, first 01/11/2011  . Polycystic ovarian syndrome   . SVD (spontaneous vaginal delivery) 01/11/2011    Current Outpatient Medications  Medication Sig Dispense Refill  . butalbital-acetaminophen-caffeine (FIORICET, ESGIC) 50-325-40 MG tablet Take 1-2 tablets by mouth every 6 (six) hours as needed for headache. 30 tablet 0  . levonorgestrel (MIRENA, 52 MG,) 20 MCG/24HR IUD Mirena 20 mcg/24 hr (5 years) intrauterine device  Take by intrauterine route.    . propranolol ER (INDERAL LA) 80 MG 24 hr capsule Take 1 capsule (80 mg total) by mouth daily. 30 capsule 0   No current facility-administered medications for this visit.     No Known Allergies  Family History  Problem Relation Age of Onset  . Breast cancer Mother   . Diabetes Paternal Uncle   . Alzheimer's disease Maternal Grandfather     Social History   Socioeconomic History  . Marital status: Married    Spouse name: Not on file  . Number of children: Not on file  . Years of education: Not  on file  . Highest education level: Not on file  Social Needs  . Financial resource strain: Not on file  . Food insecurity - worry: Not on file  . Food insecurity - inability: Not on file  . Transportation needs - medical: Not on file  . Transportation needs - non-medical: Not on file  Occupational History  . Not on file  Tobacco Use  . Smoking status: Never Smoker  . Smokeless tobacco: Never Used  Substance and Sexual Activity  . Alcohol use: Yes    Comment: rare  . Drug use: No  . Sexual activity: Yes    Birth control/protection: IUD  Other Topics Concern  . Not on file  Social History Narrative  . Not on file     Constitutional: Pt reports headaches. Denies fever, malaise, fatigue, or abrupt weight changes.  Neurological: Denies dizziness, difficulty with memory, difficulty with speech or problems with balance and coordination.    No other specific complaints in a complete review of systems (except as listed in HPI above).     Objective:   Physical Exam  BP 116/62   Pulse 85   Temp 98.1 F (36.7 C) (Oral)   Wt 122 lb 4 oz (55.5 kg)   SpO2 98%   BMI 21.66 kg/m  Wt Readings from Last 3 Encounters:  03/24/17 122 lb 4 oz (55.5 kg)  02/11/17 122 lb (55.3 kg)  02/06/17 123 lb  12.8 oz (56.2 kg)    General: Appears her stated age, well developed, well nourished in NAD. HEENT: Head: normal shape and size; Eyes: sclera white, no icterus, conjunctiva pink, PERRLA and EOMs intact Neurological: Alert and oriented.Coordination normal.    BMET    Component Value Date/Time   NA 139 09/02/2016 1408   NA 135 (L) 08/12/2013 1659   K 4.0 09/02/2016 1408   K 3.4 (L) 08/12/2013 1659   CL 104 09/02/2016 1408   CL 104 08/12/2013 1659   CO2 29 09/02/2016 1408   CO2 26 08/12/2013 1659   GLUCOSE 91 09/02/2016 1408   GLUCOSE 91 08/12/2013 1659   BUN 12 09/02/2016 1408   BUN 7 08/12/2013 1659   CREATININE 0.74 09/02/2016 1408   CREATININE 0.57 (L) 08/12/2013 1659    CALCIUM 9.6 09/02/2016 1408   CALCIUM 8.6 08/12/2013 1659   GFRNONAA >60 08/12/2013 1659   GFRAA >60 08/12/2013 1659    Lipid Panel  No results found for: CHOL, TRIG, HDL, CHOLHDL, VLDL, LDLCALC  CBC    Component Value Date/Time   WBC 4.9 09/02/2016 1408   RBC 4.55 09/02/2016 1408   HGB 13.9 09/02/2016 1408   HGB 13.5 08/12/2013 1659   HCT 41.0 09/02/2016 1408   HCT 38.9 08/12/2013 1659   PLT 160.0 09/02/2016 1408   PLT 183 08/12/2013 1659   MCV 90.1 09/02/2016 1408   MCV 91 08/12/2013 1659   MCH 30.7 03/12/2014 0604   MCHC 33.8 09/02/2016 1408   RDW 13.0 09/02/2016 1408   RDW 13.8 08/12/2013 1659    Hgb A1C No results found for: HGBA1C          Assessment & Plan:   Frequent Headaches:  30 mg Toradol IM today Stop Propanolol and Fioricet She is hesitant to try Topamax eRx for Imitrex sent to pharmacy Referral to neurology placed  Return precautions discussed Nicki ReaperBAITY, REGINA, NP

## 2017-03-24 NOTE — Addendum Note (Signed)
Addended by: Desmond DikeKNIGHT, Cadden Elizondo H on: 03/24/2017 11:39 AM   Modules accepted: Orders

## 2017-05-29 ENCOUNTER — Other Ambulatory Visit: Payer: Self-pay

## 2017-05-29 ENCOUNTER — Encounter: Payer: Self-pay | Admitting: Family Medicine

## 2017-05-29 ENCOUNTER — Ambulatory Visit: Payer: BLUE CROSS/BLUE SHIELD | Admitting: Family Medicine

## 2017-05-29 VITALS — BP 90/60 | HR 112 | Temp 98.9°F | Ht 63.0 in | Wt 118.5 lb

## 2017-05-29 DIAGNOSIS — G43911 Migraine, unspecified, intractable, with status migrainosus: Secondary | ICD-10-CM | POA: Diagnosis not present

## 2017-05-29 DIAGNOSIS — J069 Acute upper respiratory infection, unspecified: Secondary | ICD-10-CM | POA: Diagnosis not present

## 2017-05-29 MED ORDER — PROMETHAZINE HCL 25 MG PO TABS: 25.0000 mg | ORAL_TABLET | Freq: Four times a day (QID) | ORAL | 2 refills | Status: DC | PRN

## 2017-05-29 MED ORDER — PROMETHAZINE HCL 25 MG/ML IJ SOLN
25.0000 mg | Freq: Once | INTRAMUSCULAR | Status: AC
  Administered 2017-05-29: 25 mg via INTRAMUSCULAR

## 2017-05-29 MED ORDER — KETOROLAC TROMETHAMINE 60 MG/2ML IM SOLN
60.0000 mg | Freq: Once | INTRAMUSCULAR | Status: AC
  Administered 2017-05-29: 60 mg via INTRAMUSCULAR

## 2017-05-29 NOTE — Progress Notes (Signed)
Dr. Karleen Hampshire T. Keiron Iodice, MD, CAQ Sports Medicine Primary Care and Sports Medicine [address] [place], [address] Phone: [phone] Fax: [phone]  05/29/2017  Patient: Carol Foster, MRN: [id number], DOB: 07-31-1985, 32 y.o.  Primary Physician:  Lorre Munroe, NP   Chief Complaint  Patient presents with  . Headache    started yesterday  . Nasal Congestion  . Dizziness  . Emesis   Subjective:   Carol Foster is a 32 y.o. very pleasant female patient who presents with the following:  Nose and ears started to get congested and won't go along. Having some nausea and vomitting.   Very pleasant lady who has a history of poorly controlled migraines who is currently on Inderal at 80 mg of the LA version.  This is per prophylaxis, and she actually has a neurology appointment upcoming in the next 2 weeks.  She also has some Imitrex, which made her feel a little bit funny and have some racing heart sensations.  She also has a cold that is been ongoing over the last few days, but it is primarily her headache that brings her here in the office today.  Past Medical History, Surgical History, Social History, Family History, Problem List, Medications, and Allergies have been reviewed and updated if relevant.  Patient Active Problem List   Diagnosis Date Noted  . Frequent headaches 05/30/2016  . Polycystic ovarian syndrome     Past Medical History:  Diagnosis Date  . Gestational diabetes mellitus, antepartum   . History of chicken pox   . Hx of migraines   . Normal pregnancy, first 01/11/2011  . Polycystic ovarian syndrome   . SVD (spontaneous vaginal delivery) 01/11/2011    Past Surgical History:  Procedure Laterality Date  . TOOTH EXTRACTION      Social History   Socioeconomic History  . Marital status: Married    Spouse name: Not on file  . Number of children: Not on file  . Years of education: Not on file  . Highest education level: Not on file    Social Needs  . Financial resource strain: Not on file  . Food insecurity - worry: Not on file  . Food insecurity - inability: Not on file  . Transportation needs - medical: Not on file  . Transportation needs - non-medical: Not on file  Occupational History  . Not on file  Tobacco Use  . Smoking status: Never Smoker  . Smokeless tobacco: Never Used  Substance and Sexual Activity  . Alcohol use: Yes    Comment: rare  . Drug use: No  . Sexual activity: Yes    Birth control/protection: IUD  Other Topics Concern  . Not on file  Social History Narrative  . Not on file    Family History  Problem Relation Age of Onset  . Breast cancer Mother   . Diabetes Paternal Uncle   . Alzheimer's disease Maternal Grandfather     No Known Allergies  Medication list reviewed and updated in full in [facility].  ROS: GEN: Acute illness details above GI: Tolerating PO intake GU: maintaining adequate hydration and urination Pulm: No SOB Interactive and getting along well at home.  Otherwise, ROS is as per the HPI.  Objective:   BP 90/60   Pulse (!) 112   Temp 98.9 F (37.2 C) (Oral)   Ht 5\' 3"  (1.6 m)   Wt 118 lb 8 oz (53.8 kg)   BMI 20.99 kg/m  Gen: WDWN, NAD; A & O x3, cooperative. Pleasant.Globally Non-toxic HEENT: Normocephalic and atraumatic. Throat clear, w/o exudate, R TM clear, L TM - good landmarks, No fluid present. rhinnorhea.  MMM Frontal sinuses: NT Max sinuses: NT NECK: Anterior cervical  LAD is absent CV: RRR, No M/G/R, cap refill <2 sec PULM: Breathing comfortably in no respiratory distress. no wheezing, crackles, rhonchi EXT: No c/c/e PSYCH: Friendly, good eye contact MSK: Nml gait     Laboratory and Imaging Data:  Assessment and Plan:   Intractable migraine with status migrainosus, unspecified migraine type - Plan: ketorolac (TORADOL) injection 60 mg, promethazine (PHENERGAN) injection 25 mg  Acute URI  I am not overly concerned about her  cold, and continue with some supportive care, rest.  Poorly controlled migraines, failure to control with beta blockade.  She certainly needs to have some difference migraine prophylaxis, and thankfully she is going to be seeing neurology in the near future.  We will give her some IM Toradol and IM Phenergan now and also gave her some p.o. Phenergan to have at home given that she is quite nauseous.  Follow-up: No Follow-up on file.  Meds ordered this encounter  Medications  . promethazine (PHENERGAN) 25 MG tablet    Sig: Take 1 tablet (25 mg total) by mouth every 6 (six) hours as needed for nausea or vomiting.    Dispense:  30 tablet    Refill:  2  . ketorolac (TORADOL) injection 60 mg  . promethazine (PHENERGAN) injection 25 mg    Signed,  Lorielle Boehning T. Chayim Bialas, MD   Allergies as of 05/29/2017   No Known Allergies     Medication List        Accurate as of 05/29/17 11:59 PM. Always use your most recent med list.          butalbital-acetaminophen-caffeine 50-325-40 MG tablet Commonly known as:  FIORICET, ESGIC Take 1-2 tablets by mouth every 6 (six) hours as needed for headache.   MIRENA (52 MG) 20 MCG/24HR IUD Generic drug:  levonorgestrel Mirena 20 mcg/24 hr (5 years) intrauterine device  Take by intrauterine route.   promethazine 25 MG tablet Commonly known as:  PHENERGAN Take 1 tablet (25 mg total) by mouth every 6 (six) hours as needed for nausea or vomiting.   propranolol ER 80 MG 24 hr capsule Commonly known as:  INDERAL LA Take 1 capsule (80 mg total) by mouth daily.   SUMAtriptan 25 MG tablet Commonly known as:  IMITREX Take 1 tablet (25 mg total) by mouth every 2 (two) hours as needed for migraine. May repeat in 2 hours if headache persists or recurs.

## 2017-06-04 ENCOUNTER — Ambulatory Visit (INDEPENDENT_AMBULATORY_CARE_PROVIDER_SITE_OTHER): Payer: BLUE CROSS/BLUE SHIELD | Admitting: Internal Medicine

## 2017-06-04 ENCOUNTER — Encounter: Payer: Self-pay | Admitting: Internal Medicine

## 2017-06-04 VITALS — BP 106/72 | HR 76 | Temp 97.8°F | Wt 120.0 lb

## 2017-06-04 DIAGNOSIS — J029 Acute pharyngitis, unspecified: Secondary | ICD-10-CM

## 2017-06-04 DIAGNOSIS — R0982 Postnasal drip: Secondary | ICD-10-CM | POA: Diagnosis not present

## 2017-06-04 LAB — POCT RAPID STREP A (OFFICE): RAPID STREP A SCREEN: NEGATIVE

## 2017-06-04 MED ORDER — METHYLPREDNISOLONE ACETATE 80 MG/ML IJ SUSP
80.0000 mg | Freq: Once | INTRAMUSCULAR | Status: AC
  Administered 2017-06-04: 80 mg via INTRAMUSCULAR

## 2017-06-04 NOTE — Addendum Note (Signed)
Addended by: Roena MaladyEVONTENNO, Shamecca Whitebread Y on: 06/04/2017 05:24 PM   Modules accepted: Orders

## 2017-06-04 NOTE — Progress Notes (Signed)
Subjective:    Patient ID: Carol Foster, female    DOB: 1985-05-22, 32 y.o.   MRN: [id number]  HPI  Pt presents to the clinic today with c/o ear fullness, post nasal drip and sore throat.This started 1 week ago. She is having some difficulty swallowing. She denies fever, chills or body aches. She has tried Zyrtec and Ibuprofen with minimal relief. She saw Dr. Patsy Lageropland 1/31 for a migraine. He treated her with Toradol and Promethazine IM.  Review of Systems      Past Medical History:  Diagnosis Date  . Gestational diabetes mellitus, antepartum   . History of chicken pox   . Hx of migraines   . Normal pregnancy, first 01/11/2011  . Polycystic ovarian syndrome   . SVD (spontaneous vaginal delivery) 01/11/2011    Current Outpatient Medications  Medication Sig Dispense Refill  . butalbital-acetaminophen-caffeine (FIORICET, ESGIC) 50-325-40 MG tablet Take 1-2 tablets by mouth every 6 (six) hours as needed for headache. 30 tablet 0  . levonorgestrel (MIRENA, 52 MG,) 20 MCG/24HR IUD Mirena 20 mcg/24 hr (5 years) intrauterine device  Take by intrauterine route.    . promethazine (PHENERGAN) 25 MG tablet Take 1 tablet (25 mg total) by mouth every 6 (six) hours as needed for nausea or vomiting. 30 tablet 2  . propranolol ER (INDERAL LA) 80 MG 24 hr capsule Take 1 capsule (80 mg total) by mouth daily. 30 capsule 0  . SUMAtriptan (IMITREX) 25 MG tablet Take 1 tablet (25 mg total) by mouth every 2 (two) hours as needed for migraine. May repeat in 2 hours if headache persists or recurs. 10 tablet 0   No current facility-administered medications for this visit.     No Known Allergies  Family History  Problem Relation Age of Onset  . Breast cancer Mother   . Diabetes Paternal Uncle   . Alzheimer's disease Maternal Grandfather     Social History   Socioeconomic History  . Marital status: Married    Spouse name: Not on file  . Number of children: Not on file  . Years of education:  Not on file  . Highest education level: Not on file  Social Needs  . Financial resource strain: Not on file  . Food insecurity - worry: Not on file  . Food insecurity - inability: Not on file  . Transportation needs - medical: Not on file  . Transportation needs - non-medical: Not on file  Occupational History  . Not on file  Tobacco Use  . Smoking status: Never Smoker  . Smokeless tobacco: Never Used  Substance and Sexual Activity  . Alcohol use: Yes    Comment: rare  . Drug use: No  . Sexual activity: Yes    Birth control/protection: IUD  Other Topics Concern  . Not on file  Social History Narrative  . Not on file     Constitutional: Denies fever, malaise, fatigue, headache or abrupt weight changes.  HEENT: Pt reports ear fullness, sore throat. Denies eye pain, eye redness, ear pain, ringing in the ears, wax buildup, runny nose, nasal congestion, bloody nose Respiratory: Denies difficulty breathing, shortness of breath, cough or sputum production.     No other specific complaints in a complete review of systems (except as listed in HPI above).  Objective:   Physical Exam  BP 106/72   Pulse 76   Temp 97.8 F (36.6 C) (Oral)   Wt 120 lb (54.4 kg)   SpO2 98%   BMI  21.26 kg/m  Wt Readings from Last 3 Encounters:  06/04/17 120 lb (54.4 kg)  05/29/17 118 lb 8 oz (53.8 kg)  03/24/17 122 lb 4 oz (55.5 kg)    General: Appears her stated age, in NAD. HEENT: Head: normal shape and size, no sinus tenderness ntoed; Ears: Tm's gray and intact, normal light reflex; Nose: mucosa pink and moist, septum midline; Throat/Mouth: Teeth present, mucosa pink and moist, +PND,  no exudate, lesions or ulcerations noted.  Neck:  Anterior cervical adenopathy R>L. Pulmonary/Chest: Normal effort and positive vesicular breath sounds. No respiratory distress. No wheezes, rales or ronchi noted.   BMET    Component Value Date/Time   NA 139 09/02/2016 1408   NA 135 (L) 08/12/2013 1659   K  4.0 09/02/2016 1408   K 3.4 (L) 08/12/2013 1659   CL 104 09/02/2016 1408   CL 104 08/12/2013 1659   CO2 29 09/02/2016 1408   CO2 26 08/12/2013 1659   GLUCOSE 91 09/02/2016 1408   GLUCOSE 91 08/12/2013 1659   BUN 12 09/02/2016 1408   BUN 7 08/12/2013 1659   CREATININE 0.74 09/02/2016 1408   CREATININE 0.57 (L) 08/12/2013 1659   CALCIUM 9.6 09/02/2016 1408   CALCIUM 8.6 08/12/2013 1659   GFRNONAA >60 08/12/2013 1659   GFRAA >60 08/12/2013 1659    Lipid Panel  No results found for: CHOL, TRIG, HDL, CHOLHDL, VLDL, LDLCALC  CBC    Component Value Date/Time   WBC 4.9 09/02/2016 1408   RBC 4.55 09/02/2016 1408   HGB 13.9 09/02/2016 1408   HGB 13.5 08/12/2013 1659   HCT 41.0 09/02/2016 1408   HCT 38.9 08/12/2013 1659   PLT 160.0 09/02/2016 1408   PLT 183 08/12/2013 1659   MCV 90.1 09/02/2016 1408   MCV 91 08/12/2013 1659   MCH 30.7 03/12/2014 0604   MCHC 33.8 09/02/2016 1408   RDW 13.0 09/02/2016 1408   RDW 13.8 08/12/2013 1659    Hgb A1C No results found for: HGBA1C          Assessment & Plan:   Sore Throat secondary to PND:  RST: negative CBC and mono spot today 80 mg Depo IM  Continue Zyrtec Start Flonase OTC  Return precautions discussed Nicki Reaper, NP

## 2017-06-04 NOTE — Patient Instructions (Signed)

## 2017-06-05 LAB — MONONUCLEOSIS SCREEN: Mono Screen: NEGATIVE

## 2017-06-05 LAB — CBC
HCT: 41.4 % (ref 36.0–46.0)
HEMOGLOBIN: 14.2 g/dL (ref 12.0–15.0)
MCHC: 34.2 g/dL (ref 30.0–36.0)
MCV: 91.2 fl (ref 78.0–100.0)
PLATELETS: 172 10*3/uL (ref 150.0–400.0)
RBC: 4.55 Mil/uL (ref 3.87–5.11)
RDW: 12.3 % (ref 11.5–15.5)
WBC: 4.4 10*3/uL (ref 4.0–10.5)

## 2019-01-13 ENCOUNTER — Ambulatory Visit (INDEPENDENT_AMBULATORY_CARE_PROVIDER_SITE_OTHER): Payer: PRIVATE HEALTH INSURANCE | Admitting: Family Medicine

## 2019-01-13 ENCOUNTER — Other Ambulatory Visit: Payer: Self-pay

## 2019-01-13 ENCOUNTER — Encounter: Payer: Self-pay | Admitting: Family Medicine

## 2019-01-13 VITALS — BP 110/82 | HR 74 | Temp 98.4°F | Ht 63.25 in | Wt 128.8 lb

## 2019-01-13 DIAGNOSIS — M5412 Radiculopathy, cervical region: Secondary | ICD-10-CM | POA: Diagnosis not present

## 2019-01-13 MED ORDER — PREDNISONE 20 MG PO TABS: ORAL_TABLET | ORAL | 0 refills | Status: DC

## 2019-01-13 MED ORDER — CYCLOBENZAPRINE HCL 10 MG PO TABS: 5.0000 mg | ORAL_TABLET | Freq: Every evening | ORAL | 1 refills | Status: DC | PRN

## 2019-01-13 NOTE — Progress Notes (Signed)
Carol Roselle T. [name], MD Primary Care and Sports Medicine [facility] [address] [place], [address] Phone: [phone]  FAX: [phone]  Carol Foster - 33 y.o. female  MRN [id number]  Date of Birth: Dec 09, 1985  Visit Date: 01/13/2019  PCP: Lorre Munroe, NP  Referred by: Lorre Munroe, NP  Chief Complaint  Patient presents with  . Neck Pain    Left Side-Radiates down arm and into back   Subjective:   Carol Foster is a 33 y.o. very pleasant female patient with Body mass index is 22.63 kg/m. who presents with the following:  2 d cervical radiculopathy.  She is a very nice lady and she has had some radicular symptoms on the left side for approximately 2 to 3 days.  She also has some pain in the shoulder blade.  2 or 3 months ago she did have a similar symptom where she had some radiating pain down her arm on the left, but that went away after a day or so.  Only history is remote collarbone fracture as a child.  Currently she is out of work.  No significant loss of strength   Achy in the shoulder blade. Had it one other time, 1-2 months ago.  Went away for a day then.   Prior collarbone fx as a cihld,    Out of work right  [employer] and numb whole arm on the L  Past Medical History, Surgical History, Social History, Family History, Problem List, Medications, and Allergies have been reviewed and updated if relevant.  Patient Active Problem List   Diagnosis Date Noted  . Frequent headaches 05/30/2016  . Polycystic ovarian syndrome     Past Medical History:  Diagnosis Date  . Gestational diabetes mellitus, antepartum   . History of chicken pox   . Hx of migraines   . Normal pregnancy, first 01/11/2011  . Polycystic ovarian syndrome   . SVD (spontaneous vaginal delivery) 01/11/2011    Past Surgical History:  Procedure Laterality Date  . TOOTH EXTRACTION      Social History   Socioeconomic History   . Marital status: Married    Spouse name: Not on file  . Number of children: Not on file  . Years of education: Not on file  . Highest education level: Not on file  Occupational History  . Not on file  Social Needs  . Financial resource strain: Not on file  . Food insecurity    Worry: Not on file    Inability: Not on file  . Transportation needs    Medical: Not on file    Non-medical: Not on file  Tobacco Use  . Smoking status: Never Smoker  . Smokeless tobacco: Never Used  Substance and Sexual Activity  . Alcohol use: Yes    Comment: rare  . Drug use: No  . Sexual activity: Yes    Birth control/protection: I.U.D.  Lifestyle  . Physical activity    Days per week: Not on file    Minutes per session: Not on file  . Stress: Not on file  Relationships  . Social Musician on phone: Not on file    Gets together: Not on file    Attends religious service: Not on file    Active member of club or organization: Not on file    Attends meetings of clubs or organizations: Not on file  Relationship status: Not on file  . Intimate partner violence    Fear of current or ex partner: Not on file    Emotionally abused: Not on file    Physically abused: Not on file    Forced sexual activity: Not on file  Other Topics Concern  . Not on file  Social History Narrative  . Not on file    Family History  Problem Relation Age of Onset  . Breast cancer Mother   . Diabetes Paternal Uncle   . Alzheimer's disease Maternal Grandfather     No Known Allergies  Medication list reviewed and updated in full in [place] [facility].  GEN: no acute illness or fever CV: No chest pain or shortness of breath MSK: detailed above Neuro: neurological signs are described above ROS O/w per HPI  Objective:   BP 110/82   Pulse 74   Temp 98.4 F (36.9 C) (Temporal)   Ht 5' 3.25" (1.607 m)   Wt 128 lb 12 oz (58.4 kg)   SpO2 98%   BMI 22.63 kg/m    GEN:  Well-developed,well-nourished,in no acute distress; alert,appropriate and cooperative throughout examination HEENT: Normocephalic and atraumatic without obvious abnormalities. Ears, externally no deformities PULM: Breathing comfortably in no respiratory distress EXT: No clubbing, cyanosis, or edema PSYCH: Normally interactive. Cooperative during the interview. Pleasant. Friendly and conversant. Not anxious or depressed appearing. Normal, full affect.  CERVICAL SPINE EXAM Range of motion: Flexion, extension, lateral bending, and rotation: 10-15% loss of motion Pain with terminal motion: mild Spinous Processes: NT SCM: NT Upper paracervical muscles: mild Upper traps: NT C5-T1 intact, sensation and motor   Radiology: No results found.   Assessment and Plan:     ICD-10-CM   1. Left cervical radiculopathy  M54.12    Very pleasant lady she is had a short time of some cervical radiculopathy.  This is a recurrence after a few weeks ago as well.  I will place her on some steroids.  If symptoms persist, then PT and traction would probably be a reasonable next step.  Plain films would also be quite reasonable  Follow-up: No follow-ups on file.  Meds ordered this encounter  Medications  . predniSONE (DELTASONE) 20 MG tablet    Sig: 2 tabs po daily for 5 days, then 1 tab po daily for 5 days    Dispense:  15 tablet    Refill:  0  . cyclobenzaprine (FLEXERIL) 10 MG tablet    Sig: Take 0.5-1 tablets (5-10 mg total) by mouth at bedtime as needed for muscle spasms.    Dispense:  30 tablet    Refill:  1   No orders of the defined types were placed in this encounter.   Signed,  Elpidio GaleaSpencer T. Abdallah Hern, MD   Outpatient Encounter Medications as of 01/13/2019  Medication Sig  . promethazine (PHENERGAN) 25 MG tablet Take 1 tablet (25 mg total) by mouth every 6 (six) hours as needed for nausea or vomiting.  . propranolol ER (INDERAL LA) 80 MG 24 hr capsule Take 1 capsule (80 mg total) by mouth  daily.  . SUMAtriptan (IMITREX) 25 MG tablet Take 1 tablet (25 mg total) by mouth every 2 (two) hours as needed for migraine. May repeat in 2 hours if headache persists or recurs.  . cyclobenzaprine (FLEXERIL) 10 MG tablet Take 0.5-1 tablets (5-10 mg total) by mouth at bedtime as needed for muscle spasms.  . predniSONE (DELTASONE) 20 MG tablet 2 tabs po daily for 5  days, then 1 tab po daily for 5 days  . [DISCONTINUED] levonorgestrel (MIRENA, 52 MG,) 20 MCG/24HR IUD Mirena 20 mcg/24 hr (5 years) intrauterine device  Take by intrauterine route.   No facility-administered encounter medications on file as of 01/13/2019.

## 2019-01-19 ENCOUNTER — Telehealth: Payer: Self-pay

## 2019-01-19 NOTE — Telephone Encounter (Addendum)
Pt still having neck and lt shoulder pain and numbness and tingling in lt fingers and toes; no CP. Pt had visit with Dr Lorelei Pont on 01/13/19 and pt is still taking steroids and sees improvement since pt can now move neck but still having neck and shoulder pain as when seen on 01/13/19.pain level now is 5-6. In office note on 01/13/19 mentioned if symptoms persist then PT & traction may be next step and also possible plain films. Pt request cb after Dr Lorelei Pont reviews note.Pt has no covid symptoms, no travel and no known exposure to + covid.

## 2019-01-19 NOTE — Telephone Encounter (Signed)
Please call  I would not expect her to be better at this point.  Let's try to get her in to PT for rehab and traction if she is agreeable.

## 2019-01-19 NOTE — Telephone Encounter (Signed)
Carol Foster notified as instructed by telephone.  She is agreeable to referral for PT and traction.  She is also agreeable to giving it more time if that is what Dr. Lorelei Pont feels she needs to do.  She states she just didn't know and that is why she called.  Please advise.

## 2019-01-20 NOTE — Telephone Encounter (Signed)
Jordane notified as instructed by telephone.  She will give it more time.  If no improvement in a couple of weeks, she will call back for PT referral.

## 2019-01-20 NOTE — Telephone Encounter (Signed)
I am unclear if there is a question from here.   Not realistic to be better in 7 days.  I would do absolutely nothing for a few week, but PT is reasonable.

## 2019-05-14 ENCOUNTER — Inpatient Hospital Stay (HOSPITAL_COMMUNITY): Payer: 59

## 2019-05-14 ENCOUNTER — Other Ambulatory Visit: Payer: Self-pay

## 2019-05-14 ENCOUNTER — Encounter (HOSPITAL_COMMUNITY): Payer: Self-pay | Admitting: Obstetrics and Gynecology

## 2019-05-14 ENCOUNTER — Inpatient Hospital Stay (HOSPITAL_COMMUNITY)
Admission: AD | Admit: 2019-05-14 | Discharge: 2019-05-14 | Disposition: A | Discharge status: Home / Self Care | Payer: 59 | Attending: Obstetrics and Gynecology | Admitting: Obstetrics and Gynecology

## 2019-05-14 DIAGNOSIS — O26891 Other specified pregnancy related conditions, first trimester: Secondary | ICD-10-CM | POA: Diagnosis not present

## 2019-05-14 DIAGNOSIS — Z7952 Long term (current) use of systemic steroids: Secondary | ICD-10-CM | POA: Insufficient documentation

## 2019-05-14 DIAGNOSIS — Z3490 Encounter for supervision of normal pregnancy, unspecified, unspecified trimester: Secondary | ICD-10-CM

## 2019-05-14 DIAGNOSIS — O09522 Supervision of elderly multigravida, second trimester: Secondary | ICD-10-CM | POA: Diagnosis not present

## 2019-05-14 DIAGNOSIS — O99891 Other specified diseases and conditions complicating pregnancy: Secondary | ICD-10-CM | POA: Insufficient documentation

## 2019-05-14 DIAGNOSIS — Z3A01 Less than 8 weeks gestation of pregnancy: Secondary | ICD-10-CM | POA: Insufficient documentation

## 2019-05-14 DIAGNOSIS — M545 Low back pain, unspecified: Secondary | ICD-10-CM

## 2019-05-14 DIAGNOSIS — Z8632 Personal history of gestational diabetes: Secondary | ICD-10-CM | POA: Insufficient documentation

## 2019-05-14 DIAGNOSIS — Z803 Family history of malignant neoplasm of breast: Secondary | ICD-10-CM | POA: Insufficient documentation

## 2019-05-14 DIAGNOSIS — Z79899 Other long term (current) drug therapy: Secondary | ICD-10-CM | POA: Diagnosis not present

## 2019-05-14 DIAGNOSIS — R102 Pelvic and perineal pain unspecified side: Secondary | ICD-10-CM

## 2019-05-14 DIAGNOSIS — Z833 Family history of diabetes mellitus: Secondary | ICD-10-CM | POA: Diagnosis not present

## 2019-05-14 DIAGNOSIS — Z6790 Unspecified blood type, Rh positive: Secondary | ICD-10-CM

## 2019-05-14 DIAGNOSIS — O26899 Other specified pregnancy related conditions, unspecified trimester: Secondary | ICD-10-CM

## 2019-05-14 DIAGNOSIS — R109 Unspecified abdominal pain: Secondary | ICD-10-CM

## 2019-05-14 DIAGNOSIS — R10A0 Flank pain, unspecified side: Secondary | ICD-10-CM

## 2019-05-14 LAB — CBC WITH DIFFERENTIAL/PLATELET
Abs Immature Granulocytes: 0.04 10*3/uL (ref 0.00–0.07)
Basophils Absolute: 0 10*3/uL (ref 0.0–0.1)
Basophils Relative: 0 %
Eosinophils Absolute: 0.1 10*3/uL (ref 0.0–0.5)
Eosinophils Relative: 1 %
HCT: 37.3 % (ref 36.0–46.0)
Hemoglobin: 12.9 g/dL (ref 12.0–15.0)
Immature Granulocytes: 0 %
Lymphocytes Relative: 21 %
Lymphs Abs: 2.1 10*3/uL (ref 0.7–4.0)
MCH: 31.5 pg (ref 26.0–34.0)
MCHC: 34.6 g/dL (ref 30.0–36.0)
MCV: 91.2 fL (ref 80.0–100.0)
Monocytes Absolute: 0.7 10*3/uL (ref 0.1–1.0)
Monocytes Relative: 7 %
Neutro Abs: 7.2 10*3/uL (ref 1.7–7.7)
Neutrophils Relative %: 71 %
Platelets: 197 10*3/uL (ref 150–400)
RBC: 4.09 MIL/uL (ref 3.87–5.11)
RDW: 12.7 % (ref 11.5–15.5)
WBC: 10.2 10*3/uL (ref 4.0–10.5)
nRBC: 0 % (ref 0.0–0.2)

## 2019-05-14 LAB — URINALYSIS, ROUTINE W REFLEX MICROSCOPIC
Bilirubin Urine: NEGATIVE
Glucose, UA: 150 mg/dL — AB
Hgb urine dipstick: NEGATIVE
Ketones, ur: NEGATIVE mg/dL
Leukocytes,Ua: NEGATIVE
Nitrite: NEGATIVE
Protein, ur: NEGATIVE mg/dL
Specific Gravity, Urine: 1.011 (ref 1.005–1.030)
pH: 7 (ref 5.0–8.0)

## 2019-05-14 LAB — COMPREHENSIVE METABOLIC PANEL
ALT: 22 U/L (ref 0–44)
AST: 20 U/L (ref 15–41)
Albumin: 3.7 g/dL (ref 3.5–5.0)
Alkaline Phosphatase: 45 U/L (ref 38–126)
Anion gap: 7 (ref 5–15)
BUN: 9 mg/dL (ref 6–20)
CO2: 25 mmol/L (ref 22–32)
Calcium: 9.4 mg/dL (ref 8.9–10.3)
Chloride: 105 mmol/L (ref 98–111)
Creatinine, Ser: 0.71 mg/dL (ref 0.44–1.00)
GFR calc Af Amer: >60 mL/min (ref >60)
GFR calc non Af Amer: >60 mL/min (ref >60)
Glucose, Bld: 103 mg/dL — ABNORMAL HIGH (ref 70–99)
Potassium: 3.7 mmol/L (ref 3.5–5.1)
Sodium: 137 mmol/L (ref 135–145)
Total Bilirubin: 0.4 mg/dL (ref 0.3–1.2)
Total Protein: 6.4 g/dL — ABNORMAL LOW (ref 6.5–8.1)

## 2019-05-14 LAB — WET PREP, GENITAL
Clue Cells Wet Prep HPF POC: NONE SEEN
Sperm: NONE SEEN
Trich, Wet Prep: NONE SEEN
Yeast Wet Prep HPF POC: NONE SEEN

## 2019-05-14 LAB — HCG, QUANTITATIVE, PREGNANCY: hCG, Beta Chain, Quant, S: 27680 m[IU]/mL — ABNORMAL HIGH (ref <5)

## 2019-05-14 LAB — POCT PREGNANCY, URINE: Preg Test, Ur: POSITIVE — AB

## 2019-05-14 MED ORDER — CYCLOBENZAPRINE HCL 10 MG PO TABS
10.0000 mg | ORAL_TABLET | Freq: Once | ORAL | Status: AC
  Administered 2019-05-14: 10 mg via ORAL
  Filled 2019-05-14: qty 1

## 2019-05-14 MED ORDER — CYCLOBENZAPRINE HCL 10 MG PO TABS: 10.0000 mg | ORAL_TABLET | Freq: Three times a day (TID) | ORAL | 0 refills | Status: DC | PRN

## 2019-05-14 MED ORDER — ACETAMINOPHEN 500 MG PO TABS
1000.0000 mg | ORAL_TABLET | Freq: Once | ORAL | Status: AC
  Administered 2019-05-14: 1000 mg via ORAL
  Filled 2019-05-14: qty 2

## 2019-05-14 NOTE — MAU Note (Signed)
Pt states she had a +upt last Friday. Had a doctor's appt yesterday with Dr. Cherly Hensen. Pt did not have ultrasound. Only had labs drawn. Told her she had pain in mid to low back, vagina and and belly. Reports it has been going on for a few weeks now. Has not tried anything. Has increased water intake. Rates 6/10. Denies vaginal bleeding. Has some discharge. Pt reports she feels like it feels like she is 9 months pregnant. Worse with prolonged sitting and standing. LMP: unsure. Has a hx of PCOS.

## 2019-05-14 NOTE — MAU Provider Note (Signed)
History     CSN: [id number]  Arrival date and time: 05/14/19 1847   First Provider Initiated Contact with Patient 05/14/19 1958      Chief Complaint  Patient presents with  . Back Pain  . Abdominal Pain  . Vaginal Pain   Carol Foster is a 34 y.o. G3P2002 at Unknown who presents to MAU for LBP, bilateral pelvic and left flank pain. Pt reports she was seen at Dr. Purnell Shoemaker office yesterday (who is not available until 05/18/2019) for an hCG draw because of her pain. Pt reports she has PCOS and does not have regular periods, and got her first positive pregnancy test last Friday. Pt reports she had her IUD removed in August 2020 and has been trying to conceive since that time. Pt reports she has not had an US done this pregnancy. Pt denies history of kidney stones.  Onset: two weeks ago Location: LBP (midline), left flank pain, bilateral pelvic pain Duration: 2 weeks Character: unable to lay on sides/stomach without discomfort, aching, constant Aggravating/Associated: moving, bending over/none Relieving: lying still Treatment: none Severity: 6/10  Pt denies VB, vaginal discharge/odor/itching. Pt denies N/V, abdominal pain, constipation, diarrhea, or urinary problems. Pt denies fever, chills, fatigue, sweating or changes in appetite. Pt denies SOB or chest pain. Pt denies dizziness, HA, light-headedness, weakness.  Problems this pregnancy include: unknown dates, pt has not yet been seen. Allergies? NKDA Current medications/supplements? PNVs Prenatal care provider? Dr. Cherly Hensen, no appt scheduled   OB History    Gravida  3   Para  2   Term  2   Preterm  0   AB  0   Living  2     SAB  0   TAB  0   Ectopic  0   Multiple  0   Live Births  2           Past Medical History:  Diagnosis Date  . Gestational diabetes mellitus, antepartum   . History of chicken pox   . Hx of migraines   . Normal pregnancy, first 01/11/2011  . Polycystic ovarian syndrome    . SVD (spontaneous vaginal delivery) 01/11/2011    Past Surgical History:  Procedure Laterality Date  . TOOTH EXTRACTION      Family History  Problem Relation Age of Onset  . Breast cancer Mother   . Diabetes Paternal Uncle   . Alzheimer's disease Maternal Grandfather     Social History   Tobacco Use  . Smoking status: Never Smoker  . Smokeless tobacco: Never Used  Substance Use Topics  . Alcohol use: Yes    Comment: rare  . Drug use: No    Allergies: No Known Allergies  Medications Prior to Admission  Medication Sig Dispense Refill Last Dose  . cyclobenzaprine (FLEXERIL) 10 MG tablet Take 0.5-1 tablets (5-10 mg total) by mouth at bedtime as needed for muscle spasms. 30 tablet 1   . predniSONE (DELTASONE) 20 MG tablet 2 tabs po daily for 5 days, then 1 tab po daily for 5 days 15 tablet 0   . promethazine (PHENERGAN) 25 MG tablet Take 1 tablet (25 mg total) by mouth every 6 (six) hours as needed for nausea or vomiting. 30 tablet 2   . propranolol ER (INDERAL LA) 80 MG 24 hr capsule Take 1 capsule (80 mg total) by mouth daily. 30 capsule 0   . SUMAtriptan (IMITREX) 25 MG tablet Take 1 tablet (25 mg total) by mouth every 2 (  two) hours as needed for migraine. May repeat in 2 hours if headache persists or recurs. 10 tablet 0     Review of Systems  Constitutional: Negative for chills, diaphoresis, fatigue and fever.  Eyes: Negative for visual disturbance.  Respiratory: Negative for shortness of breath.   Cardiovascular: Negative for chest pain.  Gastrointestinal: Negative for abdominal pain, constipation, diarrhea, nausea and vomiting.  Genitourinary: Positive for pelvic pain. Negative for dysuria, flank pain, frequency, urgency, vaginal bleeding and vaginal discharge.  Musculoskeletal: Positive for back pain (LBP, left flank pain).  Neurological: Negative for dizziness, weakness, light-headedness and headaches.   Physical Exam   Blood pressure 122/78, pulse (!) 110,  temperature 98.1 F (36.7 C), temperature source Oral, resp. rate 18, height 5\' 3"  (1.6 m), weight 62.7 kg, SpO2 98 %, unknown if currently breastfeeding.  Patient Vitals for the past 24 hrs:  BP Temp Temp src Pulse Resp SpO2 Height Weight  05/14/19 1920 122/78 98.1 F (36.7 C) Oral (!) 110 18 98 % 5\' 3"  (1.6 m) 62.7 kg   Physical Exam  Constitutional: She is oriented to person, place, and time. She appears well-developed and well-nourished. No distress.  HENT:  Head: Normocephalic and atraumatic.  Respiratory: Effort normal.  GI: Soft. She exhibits no distension and no mass. There is abdominal tenderness in the right lower quadrant, suprapubic area and left lower quadrant. There is CVA tenderness (bilateral). There is no rebound and no guarding.  Musculoskeletal:     Lumbar back: Tenderness present. No swelling or deformity.     Comments: Pain across lower back on palpation.  Neurological: She is alert and oriented to person, place, and time.  Skin: Skin is warm and dry. She is not diaphoretic.  Psychiatric: She has a normal mood and affect. Her behavior is normal. Judgment and thought content normal.   Results for orders placed or performed during the hospital encounter of 05/14/19 (from the past 24 hour(s))  Pregnancy, urine POC     Status: Abnormal   Collection Time: 05/14/19  7:28 PM  Result Value Ref Range   Preg Test, Ur POSITIVE (A) NEGATIVE  Urinalysis, Routine w reflex microscopic     Status: Abnormal   Collection Time: 05/14/19  7:30 PM  Result Value Ref Range   Color, Urine YELLOW YELLOW   APPearance HAZY (A) CLEAR   Specific Gravity, Urine 1.011 1.005 - 1.030   pH 7.0 5.0 - 8.0   Glucose, UA 150 (A) NEGATIVE mg/dL   Hgb urine dipstick NEGATIVE NEGATIVE   Bilirubin Urine NEGATIVE NEGATIVE   Ketones, ur NEGATIVE NEGATIVE mg/dL   Protein, ur NEGATIVE NEGATIVE mg/dL   Nitrite NEGATIVE NEGATIVE   Leukocytes,Ua NEGATIVE NEGATIVE  Wet prep, genital     Status: Abnormal    Collection Time: 05/14/19  8:40 PM   Specimen: Vaginal  Result Value Ref Range   Yeast Wet Prep HPF POC NONE SEEN NONE SEEN   Trich, Wet Prep NONE SEEN NONE SEEN   Clue Cells Wet Prep HPF POC NONE SEEN NONE SEEN   WBC, Wet Prep HPF POC FEW (A) NONE SEEN   Sperm NONE SEEN   CBC with Differential/Platelet     Status: None   Collection Time: 05/14/19  8:47 PM  Result Value Ref Range   WBC 10.2 4.0 - 10.5 K/uL   RBC 4.09 3.87 - 5.11 MIL/uL   Hemoglobin 12.9 12.0 - 15.0 g/dL   HCT 05/16/19 05/16/19 - 41.4 %   MCV 91.2 80.0 -  100.0 fL   MCH 31.5 26.0 - 34.0 pg   MCHC 34.6 30.0 - 36.0 g/dL   RDW 10.212.7 72.511.5 - 36.615.5 %   Platelets 197 150 - 400 K/uL   nRBC 0.0 0.0 - 0.2 %   Neutrophils Relative % 71 %   Neutro Abs 7.2 1.7 - 7.7 K/uL   Lymphocytes Relative 21 %   Lymphs Abs 2.1 0.7 - 4.0 K/uL   Monocytes Relative 7 %   Monocytes Absolute 0.7 0.1 - 1.0 K/uL   Eosinophils Relative 1 %   Eosinophils Absolute 0.1 0.0 - 0.5 K/uL   Basophils Relative 0 %   Basophils Absolute 0.0 0.0 - 0.1 K/uL   Immature Granulocytes 0 %   Abs Immature Granulocytes 0.04 0.00 - 0.07 K/uL  Comprehensive metabolic panel     Status: Abnormal   Collection Time: 05/14/19  8:47 PM  Result Value Ref Range   Sodium 137 135 - 145 mmol/L   Potassium 3.7 3.5 - 5.1 mmol/L   Chloride 105 98 - 111 mmol/L   CO2 25 22 - 32 mmol/L   Glucose, Bld 103 (H) 70 - 99 mg/dL   BUN 9 6 - 20 mg/dL   Creatinine, Ser 4.400.71 0.44 - 1.00 mg/dL   Calcium 9.4 8.9 - 34.710.3 mg/dL   Total Protein 6.4 (L) 6.5 - 8.1 g/dL   Albumin 3.7 3.5 - 5.0 g/dL   AST 20 15 - 41 U/L   ALT 22 0 - 44 U/L   Alkaline Phosphatase 45 38 - 126 U/L   Total Bilirubin 0.4 0.3 - 1.2 mg/dL   GFR calc non Af Amer >60 >60 mL/min   GFR calc Af Amer >60 >60 mL/min   Anion gap 7 5 - 15   US RENAL  Result Date: 05/14/2019 CLINICAL DATA:  Initial evaluation for acute pain and mid to lower back. EXAM: RENAL / URINARY TRACT ULTRASOUND COMPLETE COMPARISON:  None. FINDINGS:  Right Kidney: Renal measurements: 12.2 x 3.8 x 6.0 cm = volume: 145.2 mL . Echogenicity within normal limits. No mass or hydronephrosis visualized. No visible nephrolithiasis. Left Kidney: Renal measurements: 11.6 x 4.9 x 5.9 cm = volume: 173.9 mL. Echogenicity within normal limits. No mass or hydronephrosis visualized. No visible nephrolithiasis. Bladder: Decompressed and not well visualized. Other: None. IMPRESSION: Normal renal ultrasound. No sonographic evidence for nephrolithiasis or obstructive uropathy. Electronically Signed   By: Rise MuBenjamin  McClintock M.D.   On: 05/14/2019 21:45   US OB LESS THAN 14 WEEKS WITH OB TRANSVAGINAL  Result Date: 05/14/2019 CLINICAL DATA:  34 year old pregnant female with pelvic pain. Patient is unsure of her LMP. EXAM: OBSTETRIC <14 WK US AND TRANSVAGINAL OB US TECHNIQUE: Both transabdominal and transvaginal ultrasound examinations were performed for complete evaluation of the gestation as well as the maternal uterus, adnexal regions, and pelvic cul-de-sac. Transvaginal technique was performed to assess early pregnancy. COMPARISON:  None. FINDINGS: Intrauterine gestational sac: Single intrauterine gestational sac. Yolk sac:  Seen Embryo:  Not present Cardiac Activity: N/A Heart Rate: N/A  bpm MSD: 16 mm   6 w   3 d Subchorionic hemorrhage:  None visualized. Maternal uterus/adnexae: The ovaries are unremarkable. There is a small corpus luteum in the right ovary. IMPRESSION: Single intrauterine gestational sac with an estimated gestational age of [redacted] weeks, 3 days. No fetal pole identified at this time. Follow-up with ultrasound in 7-11 days, or earlier if clinically indicated, recommended. Electronically Signed   By: Ceasar MonsArash  Radparvar M.D.  On: 05/14/2019 21:47   MAU Course  Procedures  MDM -LBP/left flank pain/bilateral pelvic pain -+CVA tenderness bilaterally, pain across low back on palpation, pain across lower quadrants of abdomen on palpation -temp 98.1 -r/o  ectopic -UA: hazy/150GLU, sending urine for culture based on symptoms -CBC w/ Diff: WNL, WBCs 10.2  -CMP: WNL -Pelvic US: single IUP with yolk sac, no embryo or fetal pole, [redacted]w[redacted]d -Renal US: normal renal ultrasound, no sonographic evidence for nephrolithiasis or obstructive uropathy. -hCG: pending at time of discharge -ABO: A Positive -WetPrep: WNL -GC/CT collected -Flexeril 10mg  and Tylenol 1000mg  given, pt reports pain now 2/10 -consulted with Dr. Glo Herring. Dr. Glo Herring to bedside to examine patient. Reviewed results with Dr. Glo Herring, per Dr. Glo Herring, pt OK to be discharged home. -pt discharged to home in stable condition  Orders Placed This Encounter  Procedures  . Wet prep, genital    Standing Status:   Standing    Number of Occurrences:   1  . Culture, OB Urine    Standing Status:   Standing    Number of Occurrences:   1  . US OB LESS THAN 14 WEEKS WITH OB TRANSVAGINAL    Standing Status:   Standing    Number of Occurrences:   1    Order Specific Question:   Symptom/Reason for Exam    Answer:   Pelvic pain in pregnancy [[id number]]  . US RENAL    Standing Status:   Standing    Number of Occurrences:   1    Order Specific Question:   Symptom/Reason for Exam    Answer:   Pelvic pain in pregnancy [[id number]]  . US OB LESS THAN 14 WEEKS WITH OB TRANSVAGINAL    Standing Status:   Future    Standing Expiration Date:   07/11/2020    Order Specific Question:   Reason for Exam (SYMPTOM  OR DIAGNOSIS REQUIRED)    Answer:   viability    Order Specific Question:   Preferred Imaging Location?    Answer:   [facility]  . Urinalysis, Routine w reflex microscopic    Standing Status:   Standing    Number of Occurrences:   1  . CBC with Differential/Platelet    Standing Status:   Standing    Number of Occurrences:   1  . Comprehensive metabolic panel    Standing Status:   Standing    Number of Occurrences:   1  . hCG, quantitative, pregnancy    Standing Status:   Standing    Number  of Occurrences:   1  . Pregnancy, urine POC    Standing Status:   Standing    Number of Occurrences:   1  . Discharge patient    Order Specific Question:   Discharge disposition    Answer:   01-Home or Self Care [1]    Order Specific Question:   Discharge patient date    Answer:   05/14/2019   Meds ordered this encounter  Medications  . acetaminophen (TYLENOL) tablet 1,000 mg  . cyclobenzaprine (FLEXERIL) tablet 10 mg  . cyclobenzaprine (FLEXERIL) 10 MG tablet    Sig: Take 1 tablet (10 mg total) by mouth 3 (three) times daily as needed.    Dispense:  30 tablet    Refill:  0    Order Specific Question:   Supervising Provider    Answer:   Aletha Halim [[id number]]   Assessment and Plan   1. Pelvic pain in pregnancy  2. Intrauterine pregnancy   3. Low back pain during pregnancy in first trimester   4. Flank pain   5. Blood type, Rh positive    Allergies as of 05/14/2019   No Known Allergies     Medication List    TAKE these medications   cyclobenzaprine 10 MG tablet Commonly known as: FLEXERIL Take 1 tablet (10 mg total) by mouth 3 (three) times daily as needed. What changed:   how much to take  when to take this  reasons to take this   predniSONE 20 MG tablet Commonly known as: DELTASONE 2 tabs po daily for 5 days, then 1 tab po daily for 5 days   promethazine 25 MG tablet Commonly known as: PHENERGAN Take 1 tablet (25 mg total) by mouth every 6 (six) hours as needed for nausea or vomiting.   propranolol ER 80 MG 24 hr capsule Commonly known as: INDERAL LA Take 1 capsule (80 mg total) by mouth daily.   SUMAtriptan 25 MG tablet Commonly known as: IMITREX Take 1 tablet (25 mg total) by mouth every 2 (two) hours as needed for migraine. May repeat in 2 hours if headache persists or recurs.      -will call with lab results, if positive -ordered f/u US in 10-14days for viability -RX for Flexeril per pt request, pt advised not to take with Phenergan -pt to  return with new/worsening symptoms/pain unrelieved by pain medication/fever/urinary issues -return MAU precautions given -pt discharged to home in stable condition  Meg E  05/14/2019, 10:06 PM

## 2019-05-14 NOTE — Discharge Instructions (Signed)
Safe Medications in Pregnancy    Acne: Benzoyl Peroxide Salicylic Acid  Backache/Headache: Tylenol: 2 regular strength every 4 hours OR              2 Extra strength every 6 hours  Colds/Coughs/Allergies: Benadryl (alcohol free) 25 mg every 6 hours as needed Breath right strips Claritin Cepacol throat lozenges Chloraseptic throat spray Cold-Eeze- up to three times per day Cough drops, alcohol free Flonase (by prescription only) Guaifenesin Mucinex Robitussin DM (plain only, alcohol free) Saline nasal spray/drops Sudafed (pseudoephedrine) & Actifed ** use only after [redacted] weeks gestation and if you do not have high blood pressure Tylenol Vicks Vaporub Zinc lozenges Zyrtec   Constipation: Colace Ducolax suppositories Fleet enema Glycerin suppositories Metamucil Milk of magnesia Miralax Senokot Smooth move tea  Diarrhea: Kaopectate Imodium A-D  *NO pepto Bismol  Hemorrhoids: Anusol Anusol HC Preparation H Tucks  Indigestion: Tums Maalox Mylanta Zantac  Pepcid  Insomnia: Benadryl (alcohol free) 25mg  every 6 hours as needed Tylenol PM Unisom, no Gelcaps  Leg Cramps: Tums MagGel  Nausea/Vomiting:  Bonine Dramamine Emetrol Ginger extract Sea bands Meclizine  Nausea medication to take during pregnancy:  Unisom (doxylamine succinate 25 mg tablets) Take one tablet daily at bedtime. If symptoms are not adequately controlled, the dose can be increased to a maximum recommended dose of two tablets daily (1/2 tablet in the morning, 1/2 tablet mid-afternoon and one at bedtime). Vitamin B6 100mg  tablets. Take one tablet twice a day (up to 200 mg per day).  Skin Rashes: Aveeno products Benadryl cream or 25mg  every 6 hours as needed Calamine Lotion 1% cortisone cream  Yeast infection: Gyne-lotrimin 7 Monistat 7   **If taking multiple medications, please check labels to avoid duplicating the same active ingredients **take  medication as directed on the label ** Do not exceed 4000 mg of tylenol in 24 hours **Do not take medications that contain aspirin or ibuprofen      Abdominal Pain During Pregnancy  Abdominal pain is common during pregnancy, and has many possible causes. Some causes are more serious than others, and sometimes the cause is not known. Abdominal pain can be a sign that labor is starting. It can also be caused by normal growth and stretching of muscles and ligaments during pregnancy. Always tell your health care provider if you have any abdominal pain. Follow these instructions at home:  Do not have sex or put anything in your vagina until your pain goes away completely.  Get plenty of rest until your pain improves.  Drink enough fluid to keep your urine pale yellow.  Take over-the-counter and prescription medicines only as told by your health care provider.  Keep all follow-up visits as told by your health care provider. This is important. Contact a health care provider if:  Your pain continues or gets worse after resting.  You have lower abdominal pain that: ? Comes and goes at regular intervals. ? Spreads to your back. ? Is similar to menstrual cramps.  You have pain or burning when you urinate. Get help right away if:  You have a fever or chills.  You have vaginal bleeding.  You are leaking fluid from your vagina.  You are passing tissue from your vagina.  You have vomiting or diarrhea that lasts for more than 24 hours.  Your baby is moving less than usual.  You feel very weak or faint.  You have shortness of breath.  You develop severe pain in your upper abdomen. Summary  Abdominal  pain is common during pregnancy, and has many possible causes.  If you experience abdominal pain during pregnancy, tell your health care provider right away.  Follow your health care provider's home care instructions and keep all follow-up visits as directed. This information is  not intended to replace advice given to you by your health care provider. Make sure you discuss any questions you have with your health care provider. Document Revised: 08/03/2018 Document Reviewed: 07/18/2016 Elsevier Patient Education  2020 [employer] Inc[employer]   Back Pain in Pregnancy Back pain during pregnancy is common. Back pain may be caused by several factors that are related to changes during your pregnancy. Follow these instructions at home: Managing pain, stiffness, and swelling      If directed, for sudden (acute) back pain, put ice on the painful area. ? Put ice in a plastic bag. ? Place a towel between your skin and the bag. ? Leave the ice on for 20 minutes, 2-3 times per day.  If directed, apply heat to the affected area before you exercise. Use the heat source that your health care provider recommends, such as a moist heat pack or a heating pad. ? Place a towel between your skin and the heat source. ? Leave the heat on for 20-30 minutes. ? Remove the heat if your skin turns bright red. This is especially important if you are unable to feel pain, heat, or cold. You may have a greater risk of getting burned.  If directed, massage the affected area. Activity  Exercise as told by your health care provider. Gentle exercise is the best way to prevent or manage back pain.  Listen to your body when lifting. If lifting hurts, ask for help or bend your knees. This uses your leg muscles instead of your back muscles.  Squat down when picking up something from the floor. Do not bend over.  Only use bed rest for short periods as told by your health care provider. Bed rest should only be used for the most severe episodes of back pain. Standing, sitting, and lying down  Do not stand in one place for long periods of time.  Use good posture when sitting. Make sure your head rests over your shoulders and is not hanging forward. Use a pillow on your lower back if necessary.  Try  sleeping on your side, preferably the left side, with a pregnancy support pillow or 1-2 regular pillows between your legs. ? If you have back pain after a night's rest, your bed may be too soft. ? A firm mattress may provide more support for your back during pregnancy. General instructions  Do not wear high heels.  Eat a healthy diet. Try to gain weight within your health care provider's recommendations.  Use a maternity girdle, elastic sling, or back brace as told by your health care provider.  Take over-the-counter and prescription medicines only as told by your health care provider.  Work with a physical therapist or massage therapist to find ways to manage back pain. Acupuncture or massage therapy may be helpful.  Keep all follow-up visits as told by your health care provider. This is important. Contact a health care provider if:  Your back pain interferes with your daily activities.  You have increasing pain in other parts of your body. Get help right away if:  You develop numbness, tingling, weakness, or problems with the use of your arms or legs.  You develop severe back pain that is not controlled with medicine.  You have a change in bowel or bladder control.  You develop shortness of breath, dizziness, or you faint.  You develop nausea, vomiting, or sweating.  You have back pain that is a rhythmic, cramping pain similar to labor pains. Labor pain is usually 1-2 minutes apart, lasts for about 1 minute, and involves a bearing down feeling or pressure in your pelvis.  You have back pain and your water breaks or you have vaginal bleeding.  You have back pain or numbness that travels down your leg.  Your back pain developed after you fell.  You develop pain on one side of your back.  You see blood in your urine.  You develop skin blisters in the area of your back pain. Summary  Back pain may be caused by several factors that are related to changes during your  pregnancy.  Follow instructions as told by your health care provider for managing pain, stiffness, and swelling.  Exercise as told by your health care provider. Gentle exercise is the best way to prevent or manage back pain.  Take over-the-counter and prescription medicines only as told by your health care provider.  Keep all follow-up visits as told by your health care provider. This is important. This information is not intended to replace advice given to you by your health care provider. Make sure you discuss any questions you have with your health care provider. Document Revised: 08/04/2018 Document Reviewed: 10/01/2017 Elsevier Patient Education  2020 [employer]  First Trimester of Pregnancy The first trimester of pregnancy is from week 1 until the end of week 13 (months 1 through 3). A week after a sperm fertilizes an egg, the egg will implant on the wall of the uterus. This embryo will begin to develop into a baby. Genes from you and your partner will form the baby. The female genes will determine whether the baby will be a boy or a girl. At 6-8 weeks, the eyes and face will be formed, and the heartbeat can be seen on ultrasound. At the end of 12 weeks, all the baby's organs will be formed. Now that you are pregnant, you will want to do everything you can to have a healthy baby. Two of the most important things are to get good prenatal care and to follow your health care provider's instructions. Prenatal care is all the medical care you receive before the baby's birth. This care will help prevent, find, and treat any problems during the pregnancy and childbirth. Body changes during your first trimester Your body goes through many changes during pregnancy. The changes vary from woman to woman.  You may gain or lose a couple of pounds at first.  You may feel sick to your stomach (nauseous) and you may throw up (vomit). If the vomiting is uncontrollable, call your health care  provider.  You may tire easily.  You may develop headaches that can be relieved by medicines. All medicines should be approved by your health care provider.  You may urinate more often. Painful urination may mean you have a bladder infection.  You may develop heartburn as a result of your pregnancy.  You may develop constipation because certain hormones are causing the muscles that push stool through your intestines to slow down.  You may develop hemorrhoids or swollen veins (varicose veins).  Your breasts may begin to grow larger and become tender. Your nipples may stick out more, and the tissue that surrounds them (areola) may become darker.  Your gums may  bleed and may be sensitive to brushing and flossing.  Dark spots or blotches (chloasma, mask of pregnancy) may develop on your face. This will likely fade after the baby is born.  Your menstrual periods will stop.  You may have a loss of appetite.  You may develop cravings for certain kinds of food.  You may have changes in your emotions from day to day, such as being excited to be pregnant or being concerned that something may go wrong with the pregnancy and baby.  You may have more vivid and strange dreams.  You may have changes in your hair. These can include thickening of your hair, rapid growth, and changes in texture. Some women also have hair loss during or after pregnancy, or hair that feels dry or thin. Your hair will most likely return to normal after your baby is born. What to expect at prenatal visits During a routine prenatal visit:  You will be weighed to make sure you and the baby are growing normally.  Your blood pressure will be taken.  Your abdomen will be measured to track your baby's growth.  The fetal heartbeat will be listened to between weeks 10 and 14 of your pregnancy.  Test results from any previous visits will be discussed. Your health care provider may ask you:  How you are feeling.  If  you are feeling the baby move.  If you have had any abnormal symptoms, such as leaking fluid, bleeding, severe headaches, or abdominal cramping.  If you are using any tobacco products, including cigarettes, chewing tobacco, and electronic cigarettes.  If you have any questions. Other tests that may be performed during your first trimester include:  Blood tests to find your blood type and to check for the presence of any previous infections. The tests will also be used to check for low iron levels (anemia) and protein on red blood cells (Rh antibodies). Depending on your risk factors, or if you previously had diabetes during pregnancy, you may have tests to check for high blood sugar that affects pregnant women (gestational diabetes).  Urine tests to check for infections, diabetes, or protein in the urine.  An ultrasound to confirm the proper growth and development of the baby.  Fetal screens for spinal cord problems (spina bifida) and Down syndrome.  HIV (human immunodeficiency virus) testing. Routine prenatal testing includes screening for HIV, unless you choose not to have this test.  You may need other tests to make sure you and the baby are doing well. Follow these instructions at home: Medicines  Follow your health care provider's instructions regarding medicine use. Specific medicines may be either safe or unsafe to take during pregnancy.  Take a prenatal vitamin that contains at least 600 micrograms (mcg) of folic acid.  If you develop constipation, try taking a stool softener if your health care provider approves. Eating and drinking   Eat a balanced diet that includes fresh fruits and vegetables, whole grains, good sources of protein such as meat, eggs, or tofu, and low-fat dairy. Your health care provider will help you determine the amount of weight gain that is right for you.  Avoid raw meat and uncooked cheese. These carry germs that can cause birth defects in the  baby.  Eating four or five small meals rather than three large meals a day may help relieve nausea and vomiting. If you start to feel nauseous, eating a few soda crackers can be helpful. Drinking liquids between meals, instead of during meals,  also seems to help ease nausea and vomiting.  Limit foods that are high in fat and processed sugars, such as fried and sweet foods.  To prevent constipation: ? Eat foods that are high in fiber, such as fresh fruits and vegetables, whole grains, and beans. ? Drink enough fluid to keep your urine clear or pale yellow. Activity  Exercise only as directed by your health care provider. Most women can continue their usual exercise routine during pregnancy. Try to exercise for 30 minutes at least 5 days a week. Exercising will help you: ? Control your weight. ? Stay in shape. ? Be prepared for labor and delivery.  Experiencing pain or cramping in the lower abdomen or lower back is a good sign that you should stop exercising. Check with your health care provider before continuing with normal exercises.  Try to avoid standing for long periods of time. Move your legs often if you must stand in one place for a long time.  Avoid heavy lifting.  Wear low-heeled shoes and practice good posture.  You may continue to have sex unless your health care provider tells you not to. Relieving pain and discomfort  Wear a good support bra to relieve breast tenderness.  Take warm sitz baths to soothe any pain or discomfort caused by hemorrhoids. Use hemorrhoid cream if your health care provider approves.  Rest with your legs elevated if you have leg cramps or low back pain.  If you develop varicose veins in your legs, wear support hose. Elevate your feet for 15 minutes, 3-4 times a day. Limit salt in your diet. Prenatal care  Schedule your prenatal visits by the twelfth week of pregnancy. They are usually scheduled monthly at first, then more often in the last 2  months before delivery.  Write down your questions. Take them to your prenatal visits.  Keep all your prenatal visits as told by your health care provider. This is important. Safety  Wear your seat belt at all times when driving.  Make a list of emergency phone numbers, including numbers for family, friends, the hospital, and police and fire departments. General instructions  Ask your health care provider for a referral to a local prenatal education class. Begin classes no later than the beginning of month 6 of your pregnancy.  Ask for help if you have counseling or nutritional needs during pregnancy. Your health care provider can offer advice or refer you to specialists for help with various needs.  Do not use hot tubs, steam rooms, or saunas.  Do not douche or use tampons or scented sanitary pads.  Do not cross your legs for long periods of time.  Avoid cat litter boxes and soil used by cats. These carry germs that can cause birth defects in the baby and possibly loss of the fetus by miscarriage or stillbirth.  Avoid all smoking, herbs, alcohol, and medicines not prescribed by your health care provider. Chemicals in these products affect the formation and growth of the baby.  Do not use any products that contain nicotine or tobacco, such as cigarettes and e-cigarettes. If you need help quitting, ask your health care provider. You may receive counseling support and other resources to help you quit.  Schedule a dentist appointment. At home, brush your teeth with a soft toothbrush and be gentle when you floss. Contact a health care provider if:  You have dizziness.  You have mild pelvic cramps, pelvic pressure, or nagging pain in the abdominal area.  You have  persistent nausea, vomiting, or diarrhea.  You have a bad smelling vaginal discharge.  You have pain when you urinate.  You notice increased swelling in your face, hands, legs, or ankles.  You are exposed to fifth  disease or chickenpox.  You are exposed to [place] measles (rubella) and have never had it. Get help right away if:  You have a fever.  You are leaking fluid from your vagina.  You have spotting or bleeding from your vagina.  You have severe abdominal cramping or pain.  You have rapid weight gain or loss.  You vomit blood or material that looks like coffee grounds.  You develop a severe headache.  You have shortness of breath.  You have any kind of trauma, such as from a fall or a car accident. Summary  The first trimester of pregnancy is from week 1 until the end of week 13 (months 1 through 3).  Your body goes through many changes during pregnancy. The changes vary from woman to woman.  You will have routine prenatal visits. During those visits, your health care provider will examine you, discuss any test results you may have, and talk with you about how you are feeling. This information is not intended to replace advice given to you by your health care provider. Make sure you discuss any questions you have with your health care provider. Document Revised: 03/28/2017 Document Reviewed: 03/27/2016 Elsevier Patient Education  2020 [employer] Inc.  Pregnancy and Urinary Tract Infection  A urinary tract infection (UTI) is an infection of any part of the urinary tract. This includes the kidneys, the tubes that connect your kidneys to your bladder (ureters), the bladder, and the tube that carries urine out of your body (urethra). These organs make, store, and get rid of urine in the body. Your health care provider may use other names to describe the infection. An upper UTI affects the ureters and kidneys (pyelonephritis). A lower UTI affects the bladder (cystitis) and urethra (urethritis). Most urinary tract infections are caused by bacteria in your genital area, around the entrance to your urinary tract (urethra). These bacteria grow and cause irritation and inflammation of your  urinary tract. You are more likely to develop a UTI during pregnancy because the physical and hormonal changes your body goes through can make it easier for bacteria to get into your urinary tract. Your growing baby also puts pressure on your bladder and can affect urine flow. It is important to recognize and treat UTIs in pregnancy because of the risk of serious complications for both you and your baby. How does this affect me? Symptoms of a UTI include:  Needing to urinate right away (urgently).  Frequent urination or passing small amounts of urine frequently.  Pain or burning with urination.  Blood in the urine.  Urine that smells bad or unusual.  Trouble urinating.  Cloudy urine.  Pain in the abdomen or lower back.  Vaginal discharge. You may also have:  Vomiting or a decreased appetite.  Confusion.  Irritability or tiredness.  A fever.  Diarrhea. How does this affect my baby? An untreated UTI during pregnancy could lead to a kidney infection or a systemic infection, which can cause health problems that could affect your baby. Possible complications of an untreated UTI include:  Giving birth to your baby before 37 weeks of pregnancy (premature).  Having a baby with a low birth weight.  Developing high blood pressure during pregnancy (preeclampsia).  Having a low hemoglobin level (anemia).  What can I do to lower my risk? To prevent a UTI:  Go to the bathroom as soon as you feel the need. Do not hold urine for long periods of time.  Always wipe from front to back, especially after a bowel movement. Use each tissue one time when you wipe.  Empty your bladder after sex.  Keep your genital area dry.  Drink 6-10 glasses of water each day.  Do not douche or use deodorant sprays. How is this treated? Treatment for this condition may include:  Antibiotic medicines that are safe to take during pregnancy.  Other medicines to treat less common causes of  UTI. Follow these instructions at home:  If you were prescribed an antibiotic medicine, take it as told by your health care provider. Do not stop using the antibiotic even if you start to feel better.  Keep all follow-up visits as told by your health care provider. This is important. Contact a health care provider if:  Your symptoms do not improve or they get worse.  You have abnormal vaginal discharge. Get help right away if you:  Have a fever.  Have nausea and vomiting.  Have back or side pain.  Feel contractions in your uterus.  Have lower belly pain.  Have a gush of fluid from your vagina.  Have blood in your urine. Summary  A urinary tract infection (UTI) is an infection of any part of the urinary tract, which includes the kidneys, ureters, bladder, and urethra.  Most urinary tract infections are caused by bacteria in your genital area, around the entrance to your urinary tract (urethra).  You are more likely to develop a UTI during pregnancy.  If you were prescribed an antibiotic medicine, take it as told by your health care provider. Do not stop using the antibiotic even if you start to feel better. This information is not intended to replace advice given to you by your health care provider. Make sure you discuss any questions you have with your health care provider. Document Revised: 08/07/2018 Document Reviewed: 03/19/2018 Elsevier Patient Education  2020 [employer] Inc.  Pyelonephritis During Pregnancy  What are the causes? This condition is caused by a bacterial infection in the lower urinary tract that spreads to the kidney. What increases the risk? You are more likely to develop this condition if:  You have diabetes.  You have a history of frequent urinary tract infections. What are the signs or symptoms? Symptoms of this condition may begin with symptoms of a lower urinary tract infection. These may include:  A frequent urge to pass urine.  Burning  pain when passing urine.  Pain and pressure in your lower abdomen.  Blood in your urine.  Cloudy or smelly urine. As the infection spreads to your kidney, you may have these symptoms:  Fever.  Chills.  Pain and tenderness in your upper abdomen or in your back and sides (flank pain). Flank pain often affects one side of the body, usually the right side.  Nausea, vomiting, or loss of appetite. How is this diagnosed? This condition may be diagnosed based on:  Your symptoms and medical history.  A physical exam.  Tests to confirm the diagnosis. These may include: ? Blood tests to check kidney function and look for signs of infection. ? Urine tests to check for signs of infection, including bacteria, white or red blood cells, and protein. ? Tests to grow and identify the type of bacteria that is causing the infection (urine culture). ?  Imaging studies of your kidneys to learn more about your condition. How is this treated? This condition is treated in the hospital with antibiotics that are given into one of your veins through an IV. Your health care provider:  Will start you on an antibiotic that is effective against common urinary tract infections.  May switch to another antibiotic if the results of your urine culture show that your infection is caused by different bacteria.  Will be careful to choose antibiotics that are the safest during pregnancy. Other treatments may include:  IV fluids if you are nauseous and not able to drink fluids.  Pain and fever medicines.  Medicines for nausea and vomiting. You will be able to go home when your infection is under control. To prevent another infection, you may need to continue taking antibiotics by mouth until your baby is born. Follow these instructions at home: Medicines   Take over-the-counter and prescription medicines only as told by your health care provider.  Take your antibiotic medicine as told by your health care  provider. Do not stop taking the antibiotic even if you start to feel better.  Continue to take your prenatal vitamins. Lifestyle   Follow instructions from your health care provider about eating or drinking restrictions. You may need to avoid: ? Foods and drinks with added sugar. ? Caffeine and fruit juice.  Drink enough fluid to keep your urine pale yellow.  Go to the bathroom frequently. Do not hold your urine. Try to empty your bladder completely.  Change your underwear every day. Wear all-cotton underwear. Do not wear tight underwear or pants. General instructions   Take these steps to lower the risk of bacteria getting into your urinary tract: ? Use liquid soap instead of bar soap when showering or bathing. Bacteria can grow on bar soap. ? When you wash yourself, clean the urethra opening first. Use a washcloth to clean the area between your vagina and anus. Pat the area dry with a clean towel. ? Wash your hands before and after you go to the bathroom. ? Wipe yourself from front to back after going to the bathroom. ? Do not use douches, perfumed soap, creams, or powders. ? Do not soak in a bath for more than 30 minutes.  Return to your normal activities as told by your health care provider. Ask your health care provider what activities are safe for you.  Keep all follow-up visits as told by your health care provider. This is important. Contact a health care provider if:  You have chills or a fever.  You have any symptoms of infection that do not get better at home.  Symptoms of infection come back.  You have a reaction or side effects from your antibiotic. Get help right away if:  You start having contractions. Summary  Pyelonephritis is an infection of the kidney or kidneys.  This condition results when a bacterial infection in the lower urinary tract spreads to the kidney.  Lower urinary tract infections are common during pregnancy.  Pyelonephritis causes  chills, a fever, flank pain, and nausea.  Pyelonephritis is a serious infection that is usually treated in the hospital with IV antibiotics. This information is not intended to replace advice given to you by your health care provider. Make sure you discuss any questions you have with your health care provider. Document Revised: 08/07/2018 Document Reviewed: 07/17/2017 Elsevier Patient Education  2020 [employer].  First Trimester of Pregnancy The first trimester of pregnancy is from  week 1 until the end of week 13 (months 1 through 3). A week after a sperm fertilizes an egg, the egg will implant on the wall of the uterus. This embryo will begin to develop into a baby. Genes from you and your partner will form the baby. The female genes will determine whether the baby will be a boy or a girl. At 6-8 weeks, the eyes and face will be formed, and the heartbeat can be seen on ultrasound. At the end of 12 weeks, all the baby's organs will be formed. Now that you are pregnant, you will want to do everything you can to have a healthy baby. Two of the most important things are to get good prenatal care and to follow your health care provider's instructions. Prenatal care is all the medical care you receive before the baby's birth. This care will help prevent, find, and treat any problems during the pregnancy and childbirth. Body changes during your first trimester Your body goes through many changes during pregnancy. The changes vary from woman to woman.  You may gain or lose a couple of pounds at first.  You may feel sick to your stomach (nauseous) and you may throw up (vomit). If the vomiting is uncontrollable, call your health care provider.  You may tire easily.  You may develop headaches that can be relieved by medicines. All medicines should be approved by your health care provider.  You may urinate more often. Painful urination may mean you have a bladder infection.  You may develop heartburn as  a result of your pregnancy.  You may develop constipation because certain hormones are causing the muscles that push stool through your intestines to slow down.  You may develop hemorrhoids or swollen veins (varicose veins).  Your breasts may begin to grow larger and become tender. Your nipples may stick out more, and the tissue that surrounds them (areola) may become darker.  Your gums may bleed and may be sensitive to brushing and flossing.  Dark spots or blotches (chloasma, mask of pregnancy) may develop on your face. This will likely fade after the baby is born.  Your menstrual periods will stop.  You may have a loss of appetite.  You may develop cravings for certain kinds of food.  You may have changes in your emotions from day to day, such as being excited to be pregnant or being concerned that something may go wrong with the pregnancy and baby.  You may have more vivid and strange dreams.  You may have changes in your hair. These can include thickening of your hair, rapid growth, and changes in texture. Some women also have hair loss during or after pregnancy, or hair that feels dry or thin. Your hair will most likely return to normal after your baby is born. What to expect at prenatal visits During a routine prenatal visit:  You will be weighed to make sure you and the baby are growing normally.  Your blood pressure will be taken.  Your abdomen will be measured to track your baby's growth.  The fetal heartbeat will be listened to between weeks 10 and 14 of your pregnancy.  Test results from any previous visits will be discussed. Your health care provider may ask you:  How you are feeling.  If you are feeling the baby move.  If you have had any abnormal symptoms, such as leaking fluid, bleeding, severe headaches, or abdominal cramping.  If you are using any tobacco products, including cigarettes,  chewing tobacco, and electronic cigarettes.  If you have any  questions. Other tests that may be performed during your first trimester include:  Blood tests to find your blood type and to check for the presence of any previous infections. The tests will also be used to check for low iron levels (anemia) and protein on red blood cells (Rh antibodies). Depending on your risk factors, or if you previously had diabetes during pregnancy, you may have tests to check for high blood sugar that affects pregnant women (gestational diabetes).  Urine tests to check for infections, diabetes, or protein in the urine.  An ultrasound to confirm the proper growth and development of the baby.  Fetal screens for spinal cord problems (spina bifida) and Down syndrome.  HIV (human immunodeficiency virus) testing. Routine prenatal testing includes screening for HIV, unless you choose not to have this test.  You may need other tests to make sure you and the baby are doing well. Follow these instructions at home: Medicines  Follow your health care provider's instructions regarding medicine use. Specific medicines may be either safe or unsafe to take during pregnancy.  Take a prenatal vitamin that contains at least 600 micrograms (mcg) of folic acid.  If you develop constipation, try taking a stool softener if your health care provider approves. Eating and drinking   Eat a balanced diet that includes fresh fruits and vegetables, whole grains, good sources of protein such as meat, eggs, or tofu, and low-fat dairy. Your health care provider will help you determine the amount of weight gain that is right for you.  Avoid raw meat and uncooked cheese. These carry germs that can cause birth defects in the baby.  Eating four or five small meals rather than three large meals a day may help relieve nausea and vomiting. If you start to feel nauseous, eating a few soda crackers can be helpful. Drinking liquids between meals, instead of during meals, also seems to help ease nausea and  vomiting.  Limit foods that are high in fat and processed sugars, such as fried and sweet foods.  To prevent constipation: ? Eat foods that are high in fiber, such as fresh fruits and vegetables, whole grains, and beans. ? Drink enough fluid to keep your urine clear or pale yellow. Activity  Exercise only as directed by your health care provider. Most women can continue their usual exercise routine during pregnancy. Try to exercise for 30 minutes at least 5 days a week. Exercising will help you: ? Control your weight. ? Stay in shape. ? Be prepared for labor and delivery.  Experiencing pain or cramping in the lower abdomen or lower back is a good sign that you should stop exercising. Check with your health care provider before continuing with normal exercises.  Try to avoid standing for long periods of time. Move your legs often if you must stand in one place for a long time.  Avoid heavy lifting.  Wear low-heeled shoes and practice good posture.  You may continue to have sex unless your health care provider tells you not to. Relieving pain and discomfort  Wear a good support bra to relieve breast tenderness.  Take warm sitz baths to soothe any pain or discomfort caused by hemorrhoids. Use hemorrhoid cream if your health care provider approves.  Rest with your legs elevated if you have leg cramps or low back pain.  If you develop varicose veins in your legs, wear support hose. Elevate your feet for 15 minutes,  3-4 times a day. Limit salt in your diet. Prenatal care  Schedule your prenatal visits by the twelfth week of pregnancy. They are usually scheduled monthly at first, then more often in the last 2 months before delivery.  Write down your questions. Take them to your prenatal visits.  Keep all your prenatal visits as told by your health care provider. This is important. Safety  Wear your seat belt at all times when driving.  Make a list of emergency phone numbers,  including numbers for family, friends, the hospital, and police and fire departments. General instructions  Ask your health care provider for a referral to a local prenatal education class. Begin classes no later than the beginning of month 6 of your pregnancy.  Ask for help if you have counseling or nutritional needs during pregnancy. Your health care provider can offer advice or refer you to specialists for help with various needs.  Do not use hot tubs, steam rooms, or saunas.  Do not douche or use tampons or scented sanitary pads.  Do not cross your legs for long periods of time.  Avoid cat litter boxes and soil used by cats. These carry germs that can cause birth defects in the baby and possibly loss of the fetus by miscarriage or stillbirth.  Avoid all smoking, herbs, alcohol, and medicines not prescribed by your health care provider. Chemicals in these products affect the formation and growth of the baby.  Do not use any products that contain nicotine or tobacco, such as cigarettes and e-cigarettes. If you need help quitting, ask your health care provider. You may receive counseling support and other resources to help you quit.  Schedule a dentist appointment. At home, brush your teeth with a soft toothbrush and be gentle when you floss. Contact a health care provider if:  You have dizziness.  You have mild pelvic cramps, pelvic pressure, or nagging pain in the abdominal area.  You have persistent nausea, vomiting, or diarrhea.  You have a bad smelling vaginal discharge.  You have pain when you urinate.  You notice increased swelling in your face, hands, legs, or ankles.  You are exposed to fifth disease or chickenpox.  You are exposed to [place] measles (rubella) and have never had it. Get help right away if:  You have a fever.  You are leaking fluid from your vagina.  You have spotting or bleeding from your vagina.  You have severe abdominal cramping or  pain.  You have rapid weight gain or loss.  You vomit blood or material that looks like coffee grounds.  You develop a severe headache.  You have shortness of breath.  You have any kind of trauma, such as from a fall or a car accident. Summary  The first trimester of pregnancy is from week 1 until the end of week 13 (months 1 through 3).  Your body goes through many changes during pregnancy. The changes vary from woman to woman.  You will have routine prenatal visits. During those visits, your health care provider will examine you, discuss any test results you may have, and talk with you about how you are feeling. This information is not intended to replace advice given to you by your health care provider. Make sure you discuss any questions you have with your health care provider. Document Revised: 03/28/2017 Document Reviewed: 03/27/2016 Elsevier Patient Education  2020 [employer].

## 2019-05-17 ENCOUNTER — Telehealth: Payer: Self-pay | Admitting: Women's Health

## 2019-05-17 DIAGNOSIS — O2341 Unspecified infection of urinary tract in pregnancy, first trimester: Secondary | ICD-10-CM

## 2019-05-17 LAB — GC/CHLAMYDIA PROBE AMP (~~LOC~~) NOT AT ARMC
Chlamydia: NEGATIVE
Comment: NEGATIVE
Comment: NORMAL
Neisseria Gonorrhea: NEGATIVE

## 2019-05-17 LAB — CULTURE, OB URINE: Culture: >=100000 — AB

## 2019-05-17 MED ORDER — CEFADROXIL 500 MG PO CAPS: 500.0000 mg | ORAL_CAPSULE | Freq: Two times a day (BID) | ORAL | 0 refills | Status: AC

## 2019-05-17 NOTE — Telephone Encounter (Signed)
Called and spoke with patient. Patient identified by two identifiers. Discussed +UTI results. Pt reports she is "feeling much better" today. Pt confirms NKDA and is only taking PNVs at this time. RX sent to pharmacy on file. pt verbalizes understanding and questions answered to patient satisfaction.  Marylen Ponto, NP  12:16 PM 05/17/2019

## 2019-06-25 ENCOUNTER — Other Ambulatory Visit: Payer: Self-pay

## 2019-06-25 ENCOUNTER — Inpatient Hospital Stay (HOSPITAL_COMMUNITY)
Admission: AD | Admit: 2019-06-25 | Discharge: 2019-06-26 | Disposition: A | Discharge status: Home / Self Care | Payer: 59 | Attending: Obstetrics and Gynecology | Admitting: Obstetrics and Gynecology

## 2019-06-25 ENCOUNTER — Encounter (HOSPITAL_COMMUNITY): Payer: Self-pay | Admitting: Obstetrics and Gynecology

## 2019-06-25 DIAGNOSIS — R42 Dizziness and giddiness: Secondary | ICD-10-CM | POA: Insufficient documentation

## 2019-06-25 DIAGNOSIS — O021 Missed abortion: Secondary | ICD-10-CM

## 2019-06-25 DIAGNOSIS — O039 Complete or unspecified spontaneous abortion without complication: Secondary | ICD-10-CM | POA: Insufficient documentation

## 2019-06-25 NOTE — MAU Note (Signed)
Patient reports to MAU stating she took medication to have pass the pregnancy because she was diagnosed with No FHR by [place] at [facility]. Pt took the pills this morning and since then has had a lot of bleeding. Pt states she thinks she passed the baby at 1300. Pt was [redacted]w[redacted]d according to fetal measurements but really by LMP was 9w.  Patient reports she has bled through her pad panty and pants. Pt reports using 9pads today.  2030 oxycodone was taken for her cramps. Pt reports the pain is currently a 3/10. Pt reports dizziness and lightheadedness. Pt reports her lips feel are numb and she feels like she has no energy.

## 2019-06-26 ENCOUNTER — Inpatient Hospital Stay (HOSPITAL_COMMUNITY): Payer: 59

## 2019-06-26 ENCOUNTER — Encounter (HOSPITAL_COMMUNITY): Payer: Self-pay | Admitting: Obstetrics and Gynecology

## 2019-06-26 DIAGNOSIS — N939 Abnormal uterine and vaginal bleeding, unspecified: Secondary | ICD-10-CM | POA: Diagnosis present

## 2019-06-26 DIAGNOSIS — O039 Complete or unspecified spontaneous abortion without complication: Secondary | ICD-10-CM | POA: Diagnosis not present

## 2019-06-26 DIAGNOSIS — Z3A12 12 weeks gestation of pregnancy: Secondary | ICD-10-CM

## 2019-06-26 DIAGNOSIS — R42 Dizziness and giddiness: Secondary | ICD-10-CM | POA: Diagnosis not present

## 2019-06-26 LAB — CBC
HCT: 37 % (ref 36.0–46.0)
HCT: 33.4 % — ABNORMAL LOW (ref 36.0–46.0)
Hemoglobin: 12.5 g/dL (ref 12.0–15.0)
Hemoglobin: 11.6 g/dL — ABNORMAL LOW (ref 12.0–15.0)
MCH: 31.4 pg (ref 26.0–34.0)
MCH: 31.9 pg (ref 26.0–34.0)
MCHC: 33.8 g/dL (ref 30.0–36.0)
MCHC: 34.7 g/dL (ref 30.0–36.0)
MCV: 93 fL (ref 80.0–100.0)
MCV: 91.8 fL (ref 80.0–100.0)
Platelets: 184 10*3/uL (ref 150–400)
Platelets: 187 10*3/uL (ref 150–400)
RBC: 3.98 MIL/uL (ref 3.87–5.11)
RBC: 3.64 MIL/uL — ABNORMAL LOW (ref 3.87–5.11)
RDW: 11.6 % (ref 11.5–15.5)
RDW: 11.7 % (ref 11.5–15.5)
WBC: 7.7 10*3/uL (ref 4.0–10.5)
WBC: 7.8 10*3/uL (ref 4.0–10.5)
nRBC: 0 % (ref 0.0–0.2)
nRBC: 0 % (ref 0.0–0.2)

## 2019-06-26 MED ORDER — METHYLERGONOVINE MALEATE 0.2 MG PO TABS: 0.2000 mg | ORAL_TABLET | Freq: Three times a day (TID) | ORAL | 0 refills | Status: AC

## 2019-06-26 MED ORDER — LACTATED RINGERS IV BOLUS
1000.0000 mL | Freq: Once | INTRAVENOUS | Status: AC
  Administered 2019-06-26: 1000 mL via INTRAVENOUS

## 2019-06-26 MED ORDER — MISOPROSTOL 200 MCG PO TABS
600.0000 ug | ORAL_TABLET | Freq: Once | ORAL | Status: AC
  Administered 2019-06-26: 600 ug via ORAL
  Filled 2019-06-26: qty 3

## 2019-06-26 NOTE — MAU Provider Note (Signed)
Chief Complaint: Vaginal Bleeding   None     SUBJECTIVE HPI: Carol Foster is a 34 y.o. G3P2002 pt of [facility] with diagnosed missed ab treated with Cytotec administration of 800 mcg today, 06/26/19, who presents to maternity admissions reporting heavy bleeding with large clots, soaking 1 pad/hour or greater, and dizziness.  She reports she had some cramping and bleeding and passed tissue around 4-5 pm today. A few hours later, she had another gush of bleeding and soaked through her clothes. The bleeding has continued at 1 fully soaked pad / hour x several hours and she is now feeling dizzy with some numbness in her lips and toes. There is associated mild abdominal cramping. There are no other symptoms. She has not tried any treatments.    HPI  Past Medical History:  Diagnosis Date  . Gestational diabetes mellitus, antepartum   . History of chicken pox   . Hx of migraines   . Normal pregnancy, first 01/11/2011  . Polycystic ovarian syndrome   . SVD (spontaneous vaginal delivery) 01/11/2011   Past Surgical History:  Procedure Laterality Date  . TOOTH EXTRACTION     Social History   Socioeconomic History  . Marital status: Married    Spouse name: Not on file  . Number of children: Not on file  . Years of education: Not on file  . Highest education level: Not on file  Occupational History  . Not on file  Tobacco Use  . Smoking status: Never Smoker  . Smokeless tobacco: Never Used  Substance and Sexual Activity  . Alcohol use: Yes    Comment: rare  . Drug use: No  . Sexual activity: Yes    Birth control/protection: None  Other Topics Concern  . Not on file  Social History Narrative  . Not on file   Social Determinants of Health   Financial Resource Strain:   . Difficulty of Paying Living Expenses: Not on file  Food Insecurity:   . Worried About Programme researcher, broadcasting/film/video in the Last Year: Not on file  . Ran Out of Food in the Last Year: Not on file  Transportation  Needs:   . Lack of Transportation (Medical): Not on file  . Lack of Transportation (Non-Medical): Not on file  Physical Activity:   . Days of Exercise per Week: Not on file  . Minutes of Exercise per Session: Not on file  Stress:   . Feeling of Stress : Not on file  Social Connections:   . Frequency of Communication with Friends and Family: Not on file  . Frequency of Social Gatherings with Friends and Family: Not on file  . Attends Religious Services: Not on file  . Active Member of Clubs or Organizations: Not on file  . Attends Banker Meetings: Not on file  . Marital Status: Not on file  Intimate Partner Violence:   . Fear of Current or Ex-Partner: Not on file  . Emotionally Abused: Not on file  . Physically Abused: Not on file  . Sexually Abused: Not on file   No current facility-administered medications on file prior to encounter.   Current Outpatient Medications on File Prior to Encounter  Medication Sig Dispense Refill  . misoprostol (CYTOTEC) 100 MCG tablet Take 100 mcg by mouth 4 (four) times daily.    Marland Kitchen oxyCODONE-acetaminophen (PERCOCET/ROXICET) 5-325 MG tablet Take 1 tablet by mouth every 6 (six) hours as needed for severe pain.    . cyclobenzaprine (FLEXERIL) 10  MG tablet Take 1 tablet (10 mg total) by mouth 3 (three) times daily as needed. 30 tablet 0  . predniSONE (DELTASONE) 20 MG tablet 2 tabs po daily for 5 days, then 1 tab po daily for 5 days 15 tablet 0  . promethazine (PHENERGAN) 25 MG tablet Take 1 tablet (25 mg total) by mouth every 6 (six) hours as needed for nausea or vomiting. 30 tablet 2  . propranolol ER (INDERAL LA) 80 MG 24 hr capsule Take 1 capsule (80 mg total) by mouth daily. 30 capsule 0  . SUMAtriptan (IMITREX) 25 MG tablet Take 1 tablet (25 mg total) by mouth every 2 (two) hours as needed for migraine. May repeat in 2 hours if headache persists or recurs. 10 tablet 0   No Known Allergies  ROS:  Review of Systems  Constitutional:  Negative for chills, fatigue and fever.  Respiratory: Negative for shortness of breath.   Cardiovascular: Negative for chest pain.  Gastrointestinal: Positive for abdominal pain. Negative for nausea and vomiting.  Genitourinary: Positive for vaginal bleeding. Negative for difficulty urinating, dysuria, flank pain, pelvic pain, vaginal discharge and vaginal pain.  Neurological: Positive for dizziness and numbness. Negative for headaches.  Psychiatric/Behavioral: Negative.      I have reviewed patient's Past Medical Hx, Surgical Hx, Family Hx, Social Hx, medications and allergies.   Physical Exam   Patient Vitals for the past 24 hrs:  BP Temp Temp src Pulse Resp SpO2  06/26/19 0448 107/68 98.3 F (36.8 C) Oral 82 -- --  06/26/19 0344 94/62 98.5 F (36.9 C) -- (!) 101 -- 99 %  06/25/19 2347 112/83 -- -- 90 -- --  06/25/19 2346 114/83 -- -- 86 -- --  06/25/19 2345 116/77 -- -- 82 -- --  06/25/19 2344 102/66 -- -- 76 -- --  06/25/19 2321 109/78 97.9 F (36.6 C) Oral 93 16 --   Orthostatic VS for the past 24 hrs:  BP- Lying Pulse- Lying BP- Sitting Pulse- Sitting BP- Standing at 0 minutes Pulse- Standing at 0 minutes  06/25/19 2343 102/66 78 116/77 80 114/83 90    Constitutional: Well-developed, well-nourished female in no acute distress.  Cardiovascular: normal rate Respiratory: normal effort GI: Abd soft, non-tender. Pos BS x 4 MS: Extremities nontender, no edema, normal ROM Neurologic: Alert and oriented x 4.  GU: Neg CVAT.  PELVIC EXAM: Large gray clot/tissue removed from cervical os during exam, with large amount of bleeding observed after removal, requiring 4-5 fox swabs to visualize cervix.     LAB RESULTS Results for orders placed or performed during the hospital encounter of 06/25/19 (from the past 24 hour(s))  CBC     Status: None   Collection Time: 06/26/19 12:32 AM  Result Value Ref Range   WBC 7.7 4.0 - 10.5 K/uL   RBC 3.98 3.87 - 5.11 MIL/uL   Hemoglobin 12.5  12.0 - 15.0 g/dL   HCT 41.3 24.4 - 01.0 %   MCV 93.0 80.0 - 100.0 fL   MCH 31.4 26.0 - 34.0 pg   MCHC 33.8 30.0 - 36.0 g/dL   RDW 27.2 53.6 - 64.4 %   Platelets 184 150 - 400 K/uL   nRBC 0.0 0.0 - 0.2 %  CBC     Status: Abnormal   Collection Time: 06/26/19  2:50 AM  Result Value Ref Range   WBC 7.8 4.0 - 10.5 K/uL   RBC 3.64 (L) 3.87 - 5.11 MIL/uL   Hemoglobin 11.6 (L) 12.0 -  15.0 g/dL   HCT 33.4 (L) 36.0 - 46.0 %   MCV 91.8 80.0 - 100.0 fL   MCH 31.9 26.0 - 34.0 pg   MCHC 34.7 30.0 - 36.0 g/dL   RDW 11.7 11.5 - 15.5 %   Platelets 187 150 - 400 K/uL   nRBC 0.0 0.0 - 0.2 %       IMAGING US OB Transvaginal  Result Date: 06/26/2019 CLINICAL DATA:  Post Cytotec bleeding EXAM: OBSTETRIC <14 WK [place] AND TRANSVAGINAL OB US TECHNIQUE: Both transabdominal and transvaginal ultrasound examinations were performed for complete evaluation of the gestation as well as the maternal uterus, adnexal regions, and pelvic cul-de-sac. Transvaginal technique was performed to assess early pregnancy. COMPARISON:  None. FINDINGS: Intrauterine gestational sac: None Yolk sac:  Not Visualized. Subchorionic hemorrhage:  None visualized. Maternal uterus/adnexae: Thickened heterogeneous endometrium is seen. Probable small amount of hyperechoic blood products within the endometrial canal. The right ovary is normal in appearance measuring 2.8 x 2.1 x 2.0 cm. The left ovary is not well seen. IMPRESSION: Thickened heterogeneous endometrium with small amount of blood products within the endometrial canal. Electronically Signed   By: Prudencio Pair M.D.   On: 06/26/2019 01:09    MAU Management/MDM: Orders Placed This Encounter  Procedures  . US OB Transvaginal  . CBC  . CBC  . Discharge patient    Meds ordered this encounter  Medications  . misoprostol (CYTOTEC) tablet 600 mcg  . lactated ringers bolus 1,000 mL  . methylergonovine (METHERGINE) 0.2 MG tablet    Sig: Take 1 tablet (0.2 mg total) by mouth 3 (three)  times daily for 3 days.    Dispense:  9 tablet    Refill:  0    Order Specific Question:   Supervising Provider    Answer:   Jonnie Kind [[id number]]    Pt hgb stable at 12.5, was 12.9 on 05/14/19.  Likely POCs removed from cervix during exam, but bleeding at 1-2 pads/hour continued.   Cytotec 600 mcg buccal x 1 dose given.  Pads weighed and counted.  Hgb redrawn. Total blood loss by weight (not counting bleeding in [place], which was not weighed) was 389 mls.   Hgb dropped to 11.6 with blood loss in MAU.   After Cytotec 600 given, pt bleeding reduced.   1000 ml LR given and pt with reduced dizziness.   Dr Garwin Brothers called and discussed pt with her, plan to discharge home with methergine 0.2 mg TID x 3 days and f/u with Dr Garwin Brothers on Monday or Tuesday.   Return to MAU as needed for increased bleeding/pain or worsening dizziness.   Pt discharged with strict bleeding precautions.  ASSESSMENT 1. SAB (spontaneous abortion)   2. Dizziness   3. Missed ab     PLAN Discharge home Allergies as of 06/26/2019   No Known Allergies     Medication List    TAKE these medications   cyclobenzaprine 10 MG tablet Commonly known as: FLEXERIL Take 1 tablet (10 mg total) by mouth 3 (three) times daily as needed.   methylergonovine 0.2 MG tablet Commonly known as: METHERGINE Take 1 tablet (0.2 mg total) by mouth 3 (three) times daily for 3 days.   misoprostol 100 MCG tablet Commonly known as: CYTOTEC Take 100 mcg by mouth 4 (four) times daily.   oxyCODONE-acetaminophen 5-325 MG tablet Commonly known as: PERCOCET/ROXICET Take 1 tablet by mouth every 6 (six) hours as needed for severe pain.   predniSONE 20 MG  tablet Commonly known as: DELTASONE 2 tabs po daily for 5 days, then 1 tab po daily for 5 days   promethazine 25 MG tablet Commonly known as: PHENERGAN Take 1 tablet (25 mg total) by mouth every 6 (six) hours as needed for nausea or vomiting.   propranolol ER 80 MG 24 hr capsule Commonly  known as: INDERAL LA Take 1 capsule (80 mg total) by mouth daily.   SUMAtriptan 25 MG tablet Commonly known as: IMITREX Take 1 tablet (25 mg total) by mouth every 2 (two) hours as needed for migraine. May repeat in 2 hours if headache persists or recurs.      Follow-up Information    Maxie Better, MD Follow up.   Specialty: Obstetrics and Gynecology Why: Call to schedule with Dr Cherly Hensen on Monday or Tuesday, 3/1 or 3/2. Return to MAU as needed for emergencies.  Contact information: [address] [place] [address] [phone]           Sharen Counter Certified Nurse-Midwife 06/26/2019  5:33 AM

## 2019-06-28 LAB — SURGICAL PATHOLOGY

## 2019-12-22 ENCOUNTER — Other Ambulatory Visit: Payer: Self-pay

## 2019-12-22 ENCOUNTER — Ambulatory Visit (INDEPENDENT_AMBULATORY_CARE_PROVIDER_SITE_OTHER)
Admission: RE | Admit: 2019-12-22 | Discharge: 2019-12-22 | Disposition: A | Discharge status: Home / Self Care | Payer: 59 | Source: Ambulatory Visit | Attending: Family Medicine | Admitting: Family Medicine

## 2019-12-22 ENCOUNTER — Ambulatory Visit (INDEPENDENT_AMBULATORY_CARE_PROVIDER_SITE_OTHER): Payer: 59 | Admitting: Family Medicine

## 2019-12-22 ENCOUNTER — Encounter: Payer: Self-pay | Admitting: Family Medicine

## 2019-12-22 VITALS — BP 122/80 | HR 85 | Temp 98.3°F | Ht 63.25 in | Wt 136.1 lb

## 2019-12-22 DIAGNOSIS — M79672 Pain in left foot: Secondary | ICD-10-CM

## 2019-12-22 DIAGNOSIS — M25572 Pain in left ankle and joints of left foot: Secondary | ICD-10-CM

## 2019-12-22 DIAGNOSIS — S93492A Sprain of other ligament of left ankle, initial encounter: Secondary | ICD-10-CM | POA: Diagnosis not present

## 2019-12-22 DIAGNOSIS — S93412A Sprain of calcaneofibular ligament of left ankle, initial encounter: Secondary | ICD-10-CM | POA: Diagnosis not present

## 2019-12-22 NOTE — Progress Notes (Signed)
[name] T. Philander Ake, MD, CAQ Sports Medicine  Primary Care and Sports Medicine [facility] at [facility] [address] [place], [address]  Phone: [phone]  FAX: [phone]  Carol Foster - 34 y.o. female  MRN [id number]  Date of Birth: Jun 22, 1985  Date: 12/22/2019  PCP: Lorre Munroe, NP  Referral: Lorre Munroe, NP  Chief Complaint  Patient presents with  . Fall    fell 12/16/19   . Ankle Pain    left ankle pain and swelling    This visit occurred during the SARS-CoV-2 public health emergency.  Safety protocols were in place, including screening questions prior to the visit, additional usage of staff PPE, and extensive cleaning of exam room while observing appropriate contact time as indicated for disinfecting solutions.   Subjective:   Carol Foster is a 34 y.o. very pleasant female patient with Body mass index is 23.92 kg/m. who presents with the following:  DOI: 12/16/2019  Larey Seat and now she has ankle pain and swelling.  She fell off of her truck onto her left foot and ankle.  At that time, she is not entirely sure what happened after she fell, and she is not clear if she inverted it or everted it.  Initially, she was not able to walk on the left foot, but now she can walk on it with a significant limp.  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  She does have some swelling and bruising throughout.  She does not have a significant history of ankle trauma or fracture.  No operative intervention on this side.  Got off a truck - not sure what Could not walk afterwards.   Able to walk on it, but it hurts a lot. Works in Airline pilot.   Review of Systems is noted in the HPI, as appropriate   Objective:   BP 122/80   Pulse 85   Temp 98.3 F (36.8 C) (Temporal)   Ht 5' 3.25" (1.607 m)   Wt 136 lb 2 oz (61.7 kg)   LMP 12/05/2019 (Approximate)   SpO2 97%   Breastfeeding Unknown    BMI 23.92 kg/m    GEN: No acute distress; alert,appropriate. PULM: Breathing comfortably in no respiratory distress PSYCH: Normally interactive.    Left foot and ankle exam: All phalanges as well as metatarsals are nontender.  She has diffuse swelling and bruising Lateral ankle pain is significant and diffuse at the CFL as well as ATFL.  She also has some pain at the lateral malleolus.  Globally there is tenderness at the anterior as well as lateral aspect of the ankle.  There is much less to minimal tenderness at the medial malleolus as well as deltoid ligament.  Drawer testing does cause pain.  Calcaneus is nontender.  Kleiger testing is negative.  Radiology: DG Ankle Complete Left  Result Date: 12/22/2019 CLINICAL DATA:  Injury diffuse pain EXAM: LEFT ANKLE COMPLETE - 3+ VIEW COMPARISON:  None. FINDINGS: There is no evidence of fracture, dislocation, or joint effusion. There is no evidence of arthropathy or other focal bone abnormality. Soft tissues are unremarkable. IMPRESSION: Negative. Electronically Signed   By: Jasmine Pang M.D.   On: 12/22/2019 19:55   DG Foot Complete Left  Result Date: 12/22/2019 CLINICAL DATA:  Diffuse pain EXAM: LEFT FOOT - COMPLETE 3+ VIEW COMPARISON:  None. FINDINGS: There is no evidence of fracture or dislocation.  There is no evidence of arthropathy or other focal bone abnormality. Soft tissues are unremarkable. IMPRESSION: Negative. Electronically Signed   By: Jasmine Pang M.D.   On: 12/22/2019 19:56     Assessment and Plan:     ICD-10-CM   1. Sprain of anterior talofibular ligament of left ankle, initial encounter  S93.492A   2. Acute left ankle pain  M25.572 DG Ankle Complete Left    DG Foot Complete Left  3. Left foot pain  M79.672 DG Foot Complete Left  4. Sprain of calcaneofibular ligament of left ankle, initial encounter  S93.412A    Total encounter time: 30 minutes. This includes total time spent on the day of encounter.  Anatomy discussion,  independent x-ray reviewed.  I placed the patient in a short cam walker boot.  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  I think that the primary issue here is ATFL greater than CFL ankle sprain.  X-ray, Ankle: AP, Lateral, and Mortise Views Indication: Ankle pain Findings: The mortise appears preserved.  On the AP view, there is a tiny sliver of radiopacity at the lateral talus.  This could be consistent with an acute injury versus possible prior injury.  Electronically Signed  By: Hannah Beat, MD On: 12/22/2019  3:40 PM EDT   XR, 3 view, AP, lateral, and oblique Indication: foot pain Findings: There is a tiny radiopacity in the posterior lateral oblique view.  This could represent a tiny fracture versus a small os trigonum. Electronically Signed  By: Hannah Beat, MD On: 12/22/2019  3:40 PM EDT   Follow-up: Return in about 1 month (around 01/22/2020).  No orders of the defined types were placed in this encounter.  Medications Discontinued During This Encounter  Medication Reason  . cyclobenzaprine (FLEXERIL) 10 MG tablet Completed Course  . misoprostol (CYTOTEC) 100 MCG tablet Completed Course  . oxyCODONE-acetaminophen (PERCOCET/ROXICET) 5-325 MG tablet Completed Course  . predniSONE (DELTASONE) 20 MG tablet Completed Course  . promethazine (PHENERGAN) 25 MG tablet Completed Course  . propranolol ER (INDERAL LA) 80 MG 24 hr capsule Completed Course  . SUMAtriptan (IMITREX) 25 MG tablet Completed Course   Orders Placed This Encounter  Procedures  . DG Ankle Complete Left  . DG Foot Complete Left    Signed,  Haneefah Venturini T. Guynell Kleiber, MD   Outpatient Encounter Medications as of 12/22/2019  Medication Sig  . ibuprofen (ADVIL) 800 MG tablet Take 800 mg by mouth every 8 (eight) hours as needed.  . Multiple Vitamins-Minerals (MULTIVITAMIN WITH MINERALS) tablet Take 1 tablet by mouth daily.  . [DISCONTINUED] cyclobenzaprine  (FLEXERIL) 10 MG tablet Take 1 tablet (10 mg total) by mouth 3 (three) times daily as needed.  . [DISCONTINUED] misoprostol (CYTOTEC) 100 MCG tablet Take 100 mcg by mouth 4 (four) times daily.  . [DISCONTINUED] oxyCODONE-acetaminophen (PERCOCET/ROXICET) 5-325 MG tablet Take 1 tablet by mouth every 6 (six) hours as needed for severe pain.  . [DISCONTINUED] predniSONE (DELTASONE) 20 MG tablet 2 tabs po daily for 5 days, then 1 tab po daily for 5 days  . [DISCONTINUED] promethazine (PHENERGAN) 25 MG tablet Take 1 tablet (25 mg total) by mouth every 6 (six) hours as needed for nausea or vomiting.  . [DISCONTINUED] propranolol ER (INDERAL LA) 80 MG 24 hr capsule Take 1 capsule (80 mg total) by mouth daily.  . [DISCONTINUED] SUMAtriptan (IMITREX) 25 MG tablet Take 1 tablet (25 mg total) by mouth every  2 (two) hours as needed for migraine. May repeat in 2 hours if headache persists or recurs.   No facility-administered encounter medications on file as of 12/22/2019.     +

## 2020-01-24 ENCOUNTER — Ambulatory Visit: Payer: 59 | Admitting: Internal Medicine

## 2020-10-20 ENCOUNTER — Ambulatory Visit: Payer: 59 | Admitting: Family Medicine

## 2020-10-20 ENCOUNTER — Encounter: Payer: Self-pay | Admitting: Family Medicine

## 2020-10-20 ENCOUNTER — Other Ambulatory Visit: Payer: Self-pay

## 2020-10-20 ENCOUNTER — Ambulatory Visit (INDEPENDENT_AMBULATORY_CARE_PROVIDER_SITE_OTHER): Payer: 59 | Admitting: Family Medicine

## 2020-10-20 ENCOUNTER — Ambulatory Visit (INDEPENDENT_AMBULATORY_CARE_PROVIDER_SITE_OTHER)
Admission: RE | Admit: 2020-10-20 | Discharge: 2020-10-20 | Disposition: A | Discharge status: Home / Self Care | Payer: 59 | Source: Ambulatory Visit | Attending: Family Medicine | Admitting: Family Medicine

## 2020-10-20 VITALS — BP 118/70 | HR 65 | Temp 98.0°F | Ht 63.25 in | Wt 135.4 lb

## 2020-10-20 DIAGNOSIS — R519 Headache, unspecified: Secondary | ICD-10-CM

## 2020-10-20 MED ORDER — DEXAMETHASONE SODIUM PHOSPHATE 10 MG/ML IJ SOLN
10.0000 mg | Freq: Once | INTRAMUSCULAR | Status: AC
  Administered 2020-10-20: 10 mg via INTRAMUSCULAR

## 2020-10-20 MED ORDER — CYCLOBENZAPRINE HCL 10 MG PO TABS: 5.0000 mg | ORAL_TABLET | Freq: Two times a day (BID) | ORAL | 0 refills | Status: DC | PRN

## 2020-10-20 MED ORDER — KETOROLAC TROMETHAMINE 30 MG/ML IJ SOLN
30.0000 mg | Freq: Once | INTRAMUSCULAR | Status: AC
  Administered 2020-10-20: 30 mg via INTRAMUSCULAR

## 2020-10-20 NOTE — Patient Instructions (Signed)
I think you have had a complex migraine  Treat with toradol shot today, as well as dexamethasone shot today.  May take flexeril muscle relaxant at home when you get a migraine (sent to pharmacy). Don't take and drive as it can make you sleepy.  Given some atypical features, we will also check head CT scan.  We will be in touch with results.   Migraine Headache A migraine headache is an intense, throbbing pain on one side or both sides of the head. Migraine headaches may also cause other symptoms, such as nausea, vomiting, and sensitivity to light and noise. A migraine headache can last from 4 hours to 3 days. Talk with your doctor about what things may bring on (trigger) your migraine headaches. What are the causes? The exact cause of this condition is not known. However, a migraine may be caused when nerves in the brain become irritated and release chemicals that cause inflammation of blood vessels. This inflammation causes pain. This condition may be triggered or caused by: Drinking alcohol. Smoking. Taking medicines, such as: Medicine used to treat chest pain (nitroglycerin). Birth control pills. Estrogen. Certain blood pressure medicines. Eating or drinking products that contain nitrates, glutamate, aspartame, or tyramine. Aged cheeses, chocolate, or caffeine may also be triggers. Doing physical activity. Other things that may trigger a migraine headache include: Menstruation. Pregnancy. Hunger. Stress. Lack of sleep or too much sleep. Weather changes. Fatigue. What increases the risk? The following factors may make you more likely to experience migraine headaches: Being a certain age. This condition is more common in people who are 109-66 years old. Being female. Having a family history of migraine headaches. Being Caucasian. Having a mental health condition, such as depression or anxiety. Being obese. What are the signs or symptoms? The main symptom of this condition is  pulsating or throbbing pain. This pain may: Happen in any area of the head, such as on one side or both sides. Interfere with daily activities. Get worse with physical activity. Get worse with exposure to bright lights or loud noises. Other symptoms may include: Nausea. Vomiting. Dizziness. General sensitivity to bright lights, loud noises, or smells. Before you get a migraine headache, you may get warning signs (an aura). An aura may include: Seeing flashing lights or having blind spots. Seeing bright spots, halos, or zigzag lines. Having tunnel vision or blurred vision. Having numbness or a tingling feeling. Having trouble talking. Having muscle weakness. Some people have symptoms after a migraine headache (postdromal phase), such as: Feeling tired. Difficulty concentrating. How is this diagnosed? A migraine headache can be diagnosed based on: Your symptoms. A physical exam. Tests, such as: CT scan or an MRI of the head. These imaging tests can help rule out other causes of headaches. Taking fluid from the spine (lumbar puncture) and analyzing it (cerebrospinal fluid analysis, or CSF analysis). How is this treated? This condition may be treated with medicines that: Relieve pain. Relieve nausea. Prevent migraine headaches. Treatment for this condition may also include: Acupuncture. Lifestyle changes like avoiding foods that trigger migraine headaches. Biofeedback. Cognitive behavioral therapy. Follow these instructions at home: Medicines Take over-the-counter and prescription medicines only as told by your health care provider. Ask your health care provider if the medicine prescribed to you: Requires you to avoid driving or using heavy machinery. Can cause constipation. You may need to take these actions to prevent or treat constipation: Drink enough fluid to keep your urine pale yellow. Take over-the-counter or prescription medicines. Eat foods  that are high in fiber,  such as beans, whole grains, and fresh fruits and vegetables. Limit foods that are high in fat and processed sugars, such as fried or sweet foods. Lifestyle Do not drink alcohol. Do not use any products that contain nicotine or tobacco, such as cigarettes, e-cigarettes, and chewing tobacco. If you need help quitting, ask your health care provider. Get at least 8 hours of sleep every night. Find ways to manage stress, such as meditation, deep breathing, or yoga. General instructions     Keep a journal to find out what may trigger your migraine headaches. For example, write down: What you eat and drink. How much sleep you get. Any change to your diet or medicines. If you have a migraine headache: Avoid things that make your symptoms worse, such as bright lights. It may help to lie down in a dark, quiet room. Do not drive or use heavy machinery. Ask your health care provider what activities are safe for you while you are experiencing symptoms. Keep all follow-up visits as told by your health care provider. This is important. Contact a health care provider if: You develop symptoms that are different or more severe than your usual migraine headache symptoms. You have more than 15 headache days in one month. Get help right away if: Your migraine headache becomes severe. Your migraine headache lasts longer than 72 hours. You have a fever. You have a stiff neck. You have vision loss. Your muscles feel weak or like you cannot control them. You start to lose your balance often. You have trouble walking. You faint. You have a seizure. Summary A migraine headache is an intense, throbbing pain on one side or both sides of the head. Migraines may also cause other symptoms, such as nausea, vomiting, and sensitivity to light and noise. This condition may be treated with medicines and lifestyle changes. You may also need to avoid certain things that trigger a migraine headache. Keep a journal to  find out what may trigger your migraine headaches. Contact your health care provider if you have more than 15 headache days in a month or you develop symptoms that are different or more severe than your usual migraine headache symptoms. This information is not intended to replace advice given to you by your health care provider. Make sure you discuss any questions you have with your healthcare provider. Document Revised: 08/07/2018 Document Reviewed: 05/28/2018 Elsevier Patient Education  2022 [employer].

## 2020-10-20 NOTE — Addendum Note (Signed)
Addended by: Eustaquio Boyden on: 10/20/2020 01:42 PM   Modules accepted: Orders

## 2020-10-20 NOTE — Progress Notes (Addendum)
Patient ID: Carol Foster, female    DOB: 12/16/1985, 35 y.o.   MRN: [id number]  This visit was conducted in person.  BP 118/70   Pulse 65   Temp 98 F (36.7 C) (Temporal)   Ht 5' 3.25" (1.607 m)   Wt 135 lb 6 oz (61.4 kg)   SpO2 98%   BMI 23.79 kg/m    Orthostatic VS for the past 24 hrs (Last 3 readings):  BP- Lying BP- Standing at 3 minutes  10/20/20 1240 -- 110/84  10/20/20 1237 108/80 --    CC: HA Subjective:   HPI: Carol Foster is a 35 y.o. female presenting on 10/20/2020 for Headache (C/o HA, off and on, since 10/09/20.  Seen on 6/21 or 6/22 at [facility], tx sumatriptan.  However, pt didn't like the way it made her feel.  Stopped med.  Also, c/o dizziness and sleepiness. )   Pt of Nicki Reaper presents today with concerns over headache that started 10/12/2020 typical of prior migraines described as pressure pain bilateral frontal head associated with nausea/vomiting (vomiting provides relief), photo/phonophobia, activity limiting. Treated like normal with ibuprofen and nap with benefit but has since noted persistent mental fogginess/dyslexia with writing numbers (new) and persistent dizziness described as vertigo. No presyncope.   Seen at [facility] this week treated with imitrex 50mg  - felt awful with this medicine.   Some heat intolerance.  No fevers/chills, ST, sinus congestion, facial pressure. No vision changes or double vision. No unilateral weakness/numbness, slurred speech.   Known h/o migraines - typically manages with taking a nap and ibuprofen.  1st migraine in the past 6+ months.  Doesn't know migraine triggers.  HA not orthostatic or exertional.  Migraines not menstrual related. No regular period (has PCOS).   Grandfather with stroke age 47-70s.  Mother, grandmother - migraines No known thyroid disease in family.   She took a pregnancy test last week - negative     Relevant past medical, surgical, family and social history reviewed and updated as indicated.  Interim medical history since our last visit reviewed. Allergies and medications reviewed and updated. Outpatient Medications Prior to Visit  Medication Sig Dispense Refill   ibuprofen (ADVIL) 800 MG tablet Take 800 mg by mouth every 8 (eight) hours as needed.     Multiple Vitamins-Minerals (MULTIVITAMIN WITH MINERALS) tablet Take 1 tablet by mouth daily.     No facility-administered medications prior to visit.     Per HPI unless specifically indicated in ROS section below Review of Systems  Objective:  BP 118/70   Pulse 65   Temp 98 F (36.7 C) (Temporal)   Ht 5' 3.25" (1.607 m)   Wt 135 lb 6 oz (61.4 kg)   SpO2 98%   BMI 23.79 kg/m   Wt Readings from Last 3 Encounters:  10/20/20 135 lb 6 oz (61.4 kg)  12/22/19 136 lb 2 oz (61.7 kg)  05/14/19 138 lb 4.8 oz (62.7 kg)      Physical Exam Vitals and nursing note reviewed.  Constitutional:      Appearance: Normal appearance. She is not ill-appearing.  HENT:     Head: Normocephalic and atraumatic.     Right Ear: Tympanic membrane, ear canal and external ear normal. There is no impacted cerumen.     Left Ear: Tympanic membrane, ear canal and external ear normal. There is no impacted cerumen.     Mouth/Throat:     Mouth: Mucous membranes are moist.  Pharynx: Oropharynx is clear. No oropharyngeal exudate or posterior oropharyngeal erythema.  Eyes:     Extraocular Movements: Extraocular movements intact.     Conjunctiva/sclera: Conjunctivae normal.     Pupils: Pupils are equal, round, and reactive to light.  Neck:     Thyroid: No thyroid mass or thyromegaly.     Vascular: No carotid bruit.  Cardiovascular:     Rate and Rhythm: Normal rate and regular rhythm.     Pulses: Normal pulses.     Heart sounds: Normal heart sounds. No murmur heard. Pulmonary:     Effort: Pulmonary effort is normal. No respiratory distress.     Breath sounds: Normal breath sounds. No wheezing or rhonchi.  Musculoskeletal:     Cervical back:  Normal range of motion and neck supple. No rigidity.     Right lower leg: No edema.     Left lower leg: No edema.  Lymphadenopathy:     Cervical: No cervical adenopathy.  Skin:    General: Skin is warm and dry.     Findings: No rash.  Neurological:     Mental Status: She is alert.     Cranial Nerves: Cranial nerve deficit present. No dysarthria or facial asymmetry.     Motor: Motor function is intact.     Coordination: Coordination is intact. Romberg sign negative.     Comments:  CN 2-12 intact except for decreased sensation to bilateral lower face V2/V3  FTN intact EOMI - but difficulty with midline gaze - reproduces vertigo but without nystagmus No pronator drift  Psychiatric:        Mood and Affect: Mood normal.        Behavior: Behavior normal.      Lab Results  Component Value Date   CREATININE 0.71 05/14/2019   BUN 9 05/14/2019   NA 137 05/14/2019   K 3.7 05/14/2019   CL 105 05/14/2019   CO2 25 05/14/2019    Lab Results  Component Value Date   TSH 1.14 09/02/2016    Assessment & Plan:  This visit occurred during the SARS-CoV-2 public health emergency.  Safety protocols were in place, including screening questions prior to the visit, additional usage of staff PPE, and extensive cleaning of exam room while observing appropriate contact time as indicated for disinfecting solutions.   Problem List Items Addressed This Visit     Acute intractable headache - Primary    Features of her typical migraines however also with new atypical features of mental fogginess, bilateral lower facial numbness, vertigo and persistent headache past 1 wk. Anticipate complicated migraine. Will treat with toradol 30mg  and dexamethasone 10mg  IM today and sent home with flexeril muscle relaxant for abortive treatment. Will also check non-contrasted head CT to r/o other cause for headache with new symptoms. Pt agrees with plan. Update if ongoing symptoms despite treatment - low threshold to check  labwork.        Relevant Medications   cyclobenzaprine (FLEXERIL) 10 MG tablet   Other Relevant Orders   CT Head Wo Contrast     Meds ordered this encounter  Medications   ketorolac (TORADOL) 30 MG/ML injection 30 mg   dexamethasone (DECADRON) injection 10 mg   cyclobenzaprine (FLEXERIL) 10 MG tablet    Sig: Take 0.5-1 tablets (5-10 mg total) by mouth 2 (two) times daily as needed (migraine headache).    Dispense:  30 tablet    Refill:  0    Orders Placed This Encounter  Procedures  CT Head Wo Contrast    Annest[facility]/TR [facility]     Standing Status:   Future    Standing Expiration Date:   10/20/2021    Order Specific Question:   Is patient pregnant?    Answer:   No    Order Specific Question:   Preferred imaging location?    Answer:   [facility]      Patient instructions: I think you have had a complex migraine  Treat with toradol shot today, as well as dexamethasone shot today.  May take flexeril muscle relaxant at home when you get a migraine (sent to pharmacy). Don't take and drive as it can make you sleepy.  Given some atypical features, we will also check head CT scan.  We will be in touch with results.   Follow up plan: Return if symptoms worsen or fail to improve.  Eustaquio Boyden, MD

## 2020-10-20 NOTE — Assessment & Plan Note (Addendum)
Features of her typical migraines however also with new atypical features of mental fogginess, bilateral lower facial numbness, vertigo and persistent headache past 1 wk. Anticipate complicated migraine. Will treat with toradol 30mg  and dexamethasone 10mg  IM today and sent home with flexeril muscle relaxant for abortive treatment. Will also check non-contrasted head CT to r/o other cause for headache with new symptoms. Pt agrees with plan. Update if ongoing symptoms despite treatment - low threshold to check labwork.

## 2020-10-23 ENCOUNTER — Emergency Department (HOSPITAL_BASED_OUTPATIENT_CLINIC_OR_DEPARTMENT_OTHER)
Admission: EM | Admit: 2020-10-23 | Discharge: 2020-10-23 | Disposition: A | Discharge status: Home / Self Care | Payer: 59 | Attending: Emergency Medicine | Admitting: Emergency Medicine

## 2020-10-23 ENCOUNTER — Other Ambulatory Visit: Payer: 59

## 2020-10-23 ENCOUNTER — Other Ambulatory Visit: Payer: Self-pay

## 2020-10-23 ENCOUNTER — Encounter (HOSPITAL_BASED_OUTPATIENT_CLINIC_OR_DEPARTMENT_OTHER): Payer: Self-pay

## 2020-10-23 ENCOUNTER — Telehealth: Payer: Self-pay | Admitting: Internal Medicine

## 2020-10-23 DIAGNOSIS — R202 Paresthesia of skin: Secondary | ICD-10-CM | POA: Insufficient documentation

## 2020-10-23 DIAGNOSIS — G43809 Other migraine, not intractable, without status migrainosus: Secondary | ICD-10-CM | POA: Diagnosis not present

## 2020-10-23 DIAGNOSIS — R519 Headache, unspecified: Secondary | ICD-10-CM

## 2020-10-23 DIAGNOSIS — G43909 Migraine, unspecified, not intractable, without status migrainosus: Secondary | ICD-10-CM

## 2020-10-23 LAB — CBC WITH DIFFERENTIAL/PLATELET
Abs Immature Granulocytes: 0.02 10*3/uL (ref 0.00–0.07)
Basophils Absolute: 0 10*3/uL (ref 0.0–0.1)
Basophils Relative: 1 %
Eosinophils Absolute: 0.1 10*3/uL (ref 0.0–0.5)
Eosinophils Relative: 2 %
HCT: 38.5 % (ref 36.0–46.0)
Hemoglobin: 13.3 g/dL (ref 12.0–15.0)
Immature Granulocytes: 0 %
Lymphocytes Relative: 36 %
Lymphs Abs: 2 10*3/uL (ref 0.7–4.0)
MCH: 30.6 pg (ref 26.0–34.0)
MCHC: 34.5 g/dL (ref 30.0–36.0)
MCV: 88.7 fL (ref 80.0–100.0)
Monocytes Absolute: 0.4 10*3/uL (ref 0.1–1.0)
Monocytes Relative: 8 %
Neutro Abs: 2.9 10*3/uL (ref 1.7–7.7)
Neutrophils Relative %: 53 %
Platelets: 175 10*3/uL (ref 150–400)
RBC: 4.34 MIL/uL (ref 3.87–5.11)
RDW: 12.2 % (ref 11.5–15.5)
WBC: 5.6 10*3/uL (ref 4.0–10.5)
nRBC: 0 % (ref 0.0–0.2)

## 2020-10-23 LAB — COMPREHENSIVE METABOLIC PANEL
ALT: 44 U/L (ref 0–44)
AST: 24 U/L (ref 15–41)
Albumin: 4.3 g/dL (ref 3.5–5.0)
Alkaline Phosphatase: 52 U/L (ref 38–126)
Anion gap: 8 (ref 5–15)
BUN: 13 mg/dL (ref 6–20)
CO2: 27 mmol/L (ref 22–32)
Calcium: 9.5 mg/dL (ref 8.9–10.3)
Chloride: 103 mmol/L (ref 98–111)
Creatinine, Ser: 0.73 mg/dL (ref 0.44–1.00)
GFR, Estimated: >60 mL/min (ref >60)
Glucose, Bld: 96 mg/dL (ref 70–99)
Potassium: 3.9 mmol/L (ref 3.5–5.1)
Sodium: 138 mmol/L (ref 135–145)
Total Bilirubin: 0.3 mg/dL (ref 0.3–1.2)
Total Protein: 6.4 g/dL — ABNORMAL LOW (ref 6.5–8.1)

## 2020-10-23 MED ORDER — KETOROLAC TROMETHAMINE 15 MG/ML IJ SOLN
15.0000 mg | Freq: Once | INTRAMUSCULAR | Status: AC
  Administered 2020-10-23: 15 mg via INTRAVENOUS
  Filled 2020-10-23: qty 1

## 2020-10-23 MED ORDER — DIPHENHYDRAMINE HCL 50 MG/ML IJ SOLN
25.0000 mg | Freq: Once | INTRAMUSCULAR | Status: AC
  Administered 2020-10-23: 25 mg via INTRAVENOUS
  Filled 2020-10-23: qty 1

## 2020-10-23 MED ORDER — ACETAMINOPHEN 325 MG PO TABS
650.0000 mg | ORAL_TABLET | Freq: Once | ORAL | Status: AC
  Administered 2020-10-23: 650 mg via ORAL
  Filled 2020-10-23: qty 2

## 2020-10-23 MED ORDER — PROCHLORPERAZINE EDISYLATE 10 MG/2ML IJ SOLN
10.0000 mg | Freq: Once | INTRAMUSCULAR | Status: AC
  Administered 2020-10-23: 10 mg via INTRAVENOUS
  Filled 2020-10-23: qty 2

## 2020-10-23 NOTE — Telephone Encounter (Addendum)
Spoke to patient by telephone and was advised that she was told by Access Nurse to go to the ER . Patient stated that she plans on going to the ER around 4:00 when her husband gets to her office to take her. Patient stated that she had a CT scan and it was normal. Patient stated that the numbness and tingling seems to be getting worse and her lips are numb. Patient stated that she has felt heat in her fingers and body. Patient stated that she thought the muscle relaxer may be causing some of her symptoms to get worse so she stopped taking them. Patient stated that she is still having the tingling and numbness even after stopping the muscle relaxer for over a day.

## 2020-10-23 NOTE — ED Provider Notes (Signed)
[facility] EMERGENCY DEPT Provider Note   CSN: [id number] Arrival date & time: 10/23/20  1632     History Chief Complaint  Patient presents with   facial numbness    Carol Foster is a 35 y.o. female.  Patient with history of longstanding migraine disorder presents ER chief complaint of persistent, gradual onset headache ongoing for 7 to 8 days.  She states this is associated with numbness and tingling sensation around her mouth bilaterally in upper and lower aspects, as well as bilateral upper extremities and bilateral lower extremities.  Symptoms have been waxing waning over the course of last 3 to 4 days with headache persistent for over a week.  She saw her primary care doctor who ordered her CT scan of the brain a few days ago.  She states that symptoms have improved slightly but persist so she presents to the ER.  Denies neck pain or back pain.  Denies fevers or cough or vomiting or diarrhea.      Past Medical History:  Diagnosis Date   Gestational diabetes mellitus, antepartum    History of chicken pox    Hx of migraines    Normal pregnancy, first 01/11/2011   Polycystic ovarian syndrome    SVD (spontaneous vaginal delivery) 01/11/2011    Patient Active Problem List   Diagnosis Date Noted   Acute intractable headache 05/30/2016   Polycystic ovarian syndrome     Past Surgical History:  Procedure Laterality Date   TOOTH EXTRACTION       OB History     Gravida  3   Para  2   Term  2   Preterm  0   AB  0   Living  2      SAB  0   IAB  0   Ectopic  0   Multiple  0   Live Births  2           Family History  Problem Relation Age of Onset   Breast cancer Mother    Diabetes Paternal Uncle    Alzheimer's disease Maternal Grandfather     Social History   Tobacco Use   Smoking status: Never   Smokeless tobacco: Never  Substance Use Topics   Alcohol use: Yes    Comment: rare   Drug use: No    Home Medications Prior  to Admission medications   Medication Sig Start Date End Date Taking? Authorizing Provider  cyclobenzaprine (FLEXERIL) 10 MG tablet Take 0.5-1 tablets (5-10 mg total) by mouth 2 (two) times daily as needed (migraine headache). 10/20/20  Yes Eustaquio Boyden, MD  ibuprofen (ADVIL) 800 MG tablet Take 800 mg by mouth every 8 (eight) hours as needed. 07/05/19  Yes [provider]  Multiple Vitamins-Minerals (MULTIVITAMIN WITH MINERALS) tablet Take 1 tablet by mouth daily.   Yes [provider]    Allergies    Sumatriptan  Review of Systems   Review of Systems  Constitutional:  Negative for fever.  HENT:  Negative for ear pain.   Eyes:  Negative for pain.  Respiratory:  Negative for cough.   Cardiovascular:  Negative for chest pain.  Gastrointestinal:  Negative for abdominal pain.  Genitourinary:  Negative for flank pain.  Musculoskeletal:  Negative for back pain.  Skin:  Negative for rash.  Neurological:  Positive for headaches.   Physical Exam Updated Vital Signs BP 101/79 (BP Location: Left Arm)   Pulse 81   Temp 97.8 F (36.6 C) (  Oral)   Resp 14   Ht 5\' 5"  (1.651 m)   Wt 61.7 kg   SpO2 100%   BMI 22.63 kg/m   Physical Exam Constitutional:      General: She is not in acute distress.    Appearance: Normal appearance.  HENT:     Head: Normocephalic.     Nose: Nose normal.  Eyes:     Extraocular Movements: Extraocular movements intact.  Cardiovascular:     Rate and Rhythm: Normal rate.  Pulmonary:     Effort: Pulmonary effort is normal.  Musculoskeletal:        General: Normal range of motion.     Cervical back: Normal range of motion.  Neurological:     General: No focal deficit present.     Mental Status: She is alert and oriented to person, place, and time. Mental status is at baseline.     Cranial Nerves: No cranial nerve deficit.     Motor: No weakness.     Gait: Gait normal.     Comments: Finger-nose intact heel-to-shin intact.  Toe walking  and heel walking is intact.  Gait is normal without assistance.    ED Results / Procedures / Treatments   Labs (all labs ordered are listed, but only abnormal results are displayed) Labs Reviewed  COMPREHENSIVE METABOLIC PANEL - Abnormal; Notable for the following components:      Result Value   Total Protein 6.4 (*)    All other components within normal limits  CBC WITH DIFFERENTIAL/PLATELET    EKG None  Radiology No results found.  Procedures Procedures   Medications Ordered in ED Medications  prochlorperazine (COMPAZINE) injection 10 mg (10 mg Intravenous Given 10/23/20 1727)  ketorolac (TORADOL) 15 MG/ML injection 15 mg (15 mg Intravenous Given 10/23/20 1727)  diphenhydrAMINE (BENADRYL) injection 25 mg (25 mg Intravenous Given 10/23/20 1727)  acetaminophen (TYLENOL) tablet 650 mg (650 mg Oral Given 10/23/20 1727)    ED Course  I have reviewed the triage vital signs and the nursing notes.  Pertinent labs & imaging results that were available during my care of the patient were reviewed by me and considered in my medical decision making (see chart for details).    MDM Rules/Calculators/A&P                          Symptoms of tingling have been going on for 3 to 4 days.  The appear to be affecting her bilateral left and right sides upper and lower extremities and around her mouth.  CVA was considered but appears nonlocalizing.  Review of outside imaging of the brain appears unremarkable.  Patient given Toradol IV fluids and Compazine here with subsequent improvement of symptoms.  Will advise outpatient follow-up with neurology within the week, advising immediate return for new numbness weakness worsening symptoms or any additional concerns.  Final Clinical Impression(s) / ED Diagnoses Final diagnoses:  Other migraine without status migrainosus, not intractable  Paresthesia    Rx / DC Orders ED Discharge Orders     None        10/25/20, MD 10/23/20  1857

## 2020-10-23 NOTE — Telephone Encounter (Signed)
PLEASE NOTE: All timestamps contained within this report are represented as [place] Standard Time. CONFIDENTIALTY NOTICE: This fax transmission is intended only for the addressee. It contains information that is legally privileged, confidential or otherwise protected from use or disclosure. If you are not the intended recipient, you are strictly prohibited from reviewing, disclosing, copying using or disseminating any of this information or taking any action in reliance on or regarding this information. If you have received this fax in error, please notify us immediately by telephone so that we can arrange for its return to [place]. Phone: [phone], Toll-Free: [phone], Fax: [phone] Page: 1 of 2 Call Id: [id number] [facility] Day - Client TELEPHONE ADVICE RECORD [facility] Patient Name: Carol Foster Gender: Female DOB: 16-Oct-1985 Age: 35 Y 5 M 16 D Return Phone Number: [phone] (Primary) Address: City/ State/ Zip: [place] [address] Client [facility] Day - Client Client Site [facility] - Day Physician Eustaquio Boyden - MD Contact Type Call Who Is Calling Patient / Member / Family / Caregiver Call Type Triage / Clinical Relationship To Patient Self Return Phone Number [phone] (Primary) Chief Complaint NUMBNESS/TINGLING- sudden on one side of the body or face Reason for Call Symptomatic / Request for Health Information Initial Comment Caller states is from Dr. Nicanor Alcon office. Pt is still having numbness in her lips that is mostly on the left side and more numbness in her fingers as well as feeling heat GOTO Facility Not Listed  Translation No Nurse Assessment Nurse: Doylene Canard, RN, Lesa Date/Time (Eastern Time): 10/23/2020 1:32:38 PM Confirm and document reason for call. If symptomatic, describe symptoms. ---Caller states her lips and hands are numb. She has a slight  headache Does the patient have any new or worsening symptoms? ---Yes Will a triage be completed? ---Yes Related visit to physician within the last 2 weeks? ---Yes Does the PT have any chronic conditions? (i.e. diabetes, asthma, this includes High risk factors for pregnancy, etc.) ---Yes List chronic conditions. ---migraines Is the patient pregnant or possibly pregnant? (Ask all females between the ages of 64-55) ---No Is this a behavioral health or substance abuse call? ---No Guidelines Guideline Title Affirmed Question Affirmed Notes Nurse Date/Time Lamount Cohen Time) Neurologic Deficit Headache (and neurologic deficit) Conner, RN, Lesa 10/23/2020 1:35:07 PM PLEASE NOTE: All timestamps contained within this report are represented as [place] Standard Time. CONFIDENTIALTY NOTICE: This fax transmission is intended only for the addressee. It contains information that is legally privileged, confidential or otherwise protected from use or disclosure. If you are not the intended recipient, you are strictly prohibited from reviewing, disclosing, copying using or disseminating any of this information or taking any action in reliance on or regarding this information. If you have received this fax in error, please notify us immediately by telephone so that we can arrange for its return to [place]. Phone: [phone], Toll-Free: [phone], Fax: [phone] Page: 2 of 2 Call Id: [id number] Disp. Time Lamount Cohen Time) Disposition Final User 10/23/2020 1:30:07 PM Send to Urgent Franchot Gallo 10/23/2020 1:37:24 PM Go to ED Now Yes Doylene Canard, RN, Lesa Caller Disagree/Comply Comply Caller Understands Yes PreDisposition Did not know what to do Care Advice Given Per Guideline GO TO ED NOW: * You need to be seen in the Emergency Department. * Go to the ED at ___________ Hospital. * Leave now. Drive carefully. NOTE TO TRIAGER - DRIVING: * Another adult should drive. * Patient should not delay going to the  emergency department. CARE ADVICE given per Neurologic Deficit (Adult) guideline. Referrals GO TO FACILITY OTHER - SPECIFY

## 2020-10-23 NOTE — ED Triage Notes (Addendum)
Pt reports migraine headache starting on 6/16.  Headache has somewhat improved but has developed numbness around her mouth, bilateral arms and legs.  Initially had some nausea/vomiting but none in the last week.  Denies vision changes. Had a negative Covid test last week.  Had a head CT on Friday, 6/24 which pt states was negative.

## 2020-10-23 NOTE — Discharge Instructions (Addendum)
Call your primary care doctor or specialist as discussed in the next 2-3 days.   Return immediately back to the ER if:  Your symptoms worsen within the next 12-24 hours. You develop new symptoms such as new fevers, persistent vomiting, new pain, shortness of breath, or new weakness or numbness, or if you have any other concerns.  

## 2020-10-23 NOTE — Telephone Encounter (Signed)
Carol Foster called in stated that she is still having numbness in her lip, fingers, and its happening more on the left side.

## 2020-11-02 NOTE — Telephone Encounter (Signed)
Seen at ER last week, records reviewed. CBC and CMP WNL.  Treated with migraine cocktail (compazine, toradol, benadryl and tylenol) and sent home.  Plz call for update on migraine with facial numbness and vertigo. Would offer neuro eval if ongoing.  Intolerant to imitrex and possibly flexeril. Would offer trial of nurtec PRN migraine/

## 2020-11-03 NOTE — Telephone Encounter (Signed)
Spoke with pt's husband, Joselyn Glassman (on dpr), asking for update on pt's sxs.  States the facial numbness and vertigo have resolved but pt still c/o migraine.  Offered neuro referral and says pt tried to reach out to her previous neurologist but hasn't heard back from them so he requests the referral.  When I asked for name of neuro, he wasn't sure and then informed me he was not with the pt.  Also, says he's not sure if pt would want to try Nurtec and suggested I call her cell #  [phone].   Attempted reach pt on cell #.  No answer and vm box is not set up.

## 2020-11-06 ENCOUNTER — Telehealth: Payer: Self-pay | Admitting: Internal Medicine

## 2020-11-06 NOTE — Telephone Encounter (Signed)
Neuro referral placed.  Still need to touch base with patient about nurtec trial.

## 2020-11-06 NOTE — Addendum Note (Signed)
Addended by: Eustaquio Boyden on: 11/06/2020 08:03 AM   Modules accepted: Orders

## 2020-11-06 NOTE — Telephone Encounter (Signed)
Patient returned call . Would like a call back about referral .# [phone]

## 2020-11-06 NOTE — Telephone Encounter (Signed)
Tried calling the patient and she did not answer, she has a vm that isn't set up yet unable to leave a message.

## 2020-11-06 NOTE — Telephone Encounter (Signed)
Patient called back again and I advised patient per chart Dr Reece Agar placed referral to [facility] office and their office will call patient directly. Advised patient to follow up with them directly if she does not hear anything back in a week.

## 2020-11-14 NOTE — Telephone Encounter (Addendum)
Attempted to contact pt.  Vm box not set up.  Mailing a letter.

## 2021-01-23 ENCOUNTER — Ambulatory Visit: Payer: 59 | Admitting: Diagnostic Neuroimaging

## 2021-01-24 ENCOUNTER — Ambulatory Visit: Payer: 59 | Admitting: Psychiatry

## 2021-01-24 ENCOUNTER — Other Ambulatory Visit: Payer: Self-pay

## 2021-01-24 ENCOUNTER — Encounter: Payer: Self-pay | Admitting: Psychiatry

## 2021-01-24 VITALS — BP 118/86 | HR 84 | Ht 64.0 in | Wt 137.0 lb

## 2021-01-24 DIAGNOSIS — H538 Other visual disturbances: Secondary | ICD-10-CM

## 2021-01-24 DIAGNOSIS — G43709 Chronic migraine without aura, not intractable, without status migrainosus: Secondary | ICD-10-CM | POA: Diagnosis not present

## 2021-01-24 MED ORDER — AMITRIPTYLINE HCL 25 MG PO TABS: 25.0000 mg | ORAL_TABLET | Freq: Every day | ORAL | 3 refills | Status: DC

## 2021-01-24 MED ORDER — RIZATRIPTAN BENZOATE 10 MG PO TABS: 10.0000 mg | ORAL_TABLET | ORAL | 2 refills | Status: DC | PRN

## 2021-01-24 NOTE — Patient Instructions (Addendum)
Start amitriptyline 25 mg at bedtime for headache prevention Start Maxalt 10 mg as needed for migraine. Take at the onset of migraine. If headache recurs or does not fully resolve, you may take a second dose after 2 hours. Please avoid taking more than 2 days per week or 10 days per month. Try to limit over the counter medication use to 2 days or less to avoid rebound headaches Referral to ophthalmology for blurred vision

## 2021-01-24 NOTE — Progress Notes (Signed)
Referring:  Eustaquio Boyden, MD [address] [place],  [place] [address]  PCP: Lorre Munroe, NP  Neurology was asked to evaluate Carol Foster, a 35 year old female for a chief complaint of headaches.  Our recommendations of care will be communicated by shared medical record.    CC:  headaches  HPI:  Medical co-morbidities: PCOS  The patient presents for headaches which have been present ever since she was a child. Headaches have worsened over time. Has more mild 3-4/10 headaches 3-4 times per week and full blown 10/10 migraines every 2 weeks. Headaches can last for days to weeks at a time. Takes Advil almost every day for headaches. She will sometimes take Flexeril as well which helps a little but does not resolve the headache. She previously tried Imitrex, which made her sick to her stomach.   Headache History: Onset: childhood Triggers: lack of sleep, fasting Aura: tingling in lips and fingertips Location: frontal, temporal Quality/Description: pressure Associated Symptoms:  Photophobia: yes  Phonophobia: no  Nausea: yes Vomiting: yes Allodynia: no Other symptoms: imbalance Worse with activity?: yes Duration of headaches: 1 week  Headache days per month: 30 Headache free days per month: 0  Current Treatment: Abortive Advil  Preventative none  Prior Therapies                                 Duration of Use           Dose                          Side effect Imitrex - made her sick Flexeril    Headache Risk Factors: Headache risk factors and/or co-morbidities (-) Neck Pain (-) History of Motor Vehicle Accident (+) Sleep Disorder - insomnia, wakes up at night with headaches occasionally (-) History of Traumatic Brain Injury and/or Concussion  LABS: CBC, CMP wnl  IMAGING:  CTH 10/20/20: unremarkable  Imaging independently reviewed on January 24, 2021   Current Outpatient Medications on File Prior to Visit  Medication Sig Dispense Refill    cyclobenzaprine (FLEXERIL) 10 MG tablet Take 0.5-1 tablets (5-10 mg total) by mouth 2 (two) times daily as needed (migraine headache). 30 tablet 0   ibuprofen (ADVIL) 800 MG tablet Take 800 mg by mouth every 8 (eight) hours as needed.     Multiple Vitamins-Minerals (MULTIVITAMIN WITH MINERALS) tablet Take 1 tablet by mouth daily.     No current facility-administered medications on file prior to visit.     Allergies: Allergies  Allergen Reactions   Sumatriptan Other (See Comments)    Did not feel well when taken    Family History: Migraine or other headaches in the family:  mother Aneurysms in a first degree relative:  no Brain tumors in the family:  no Other neurological illness in the family:   dementia  Past Medical History: Past Medical History:  Diagnosis Date   Gestational diabetes mellitus, antepartum    History of chicken pox    Hx of migraines    Normal pregnancy, first 01/11/2011   Polycystic ovarian syndrome    SVD (spontaneous vaginal delivery) 01/11/2011    Past Surgical History Past Surgical History:  Procedure Laterality Date   TOOTH EXTRACTION      Social History: Social History   Tobacco Use   Smoking status: Never   Smokeless tobacco: Never  Substance Use  Topics   Alcohol use: Yes    Comment: rare   Drug use: No    ROS: Negative for fevers, chills. Positive for headaches, fatigue, numbness in fingertips. All other systems reviewed and negative unless states otherwise in HPI.   Physical Exam:   Vital Signs: BP 118/86   Pulse 84   Ht 5\' 4"  (1.626 m)   Wt 137 lb (62.1 kg)   BMI 23.52 kg/m  GENERAL: well appearing,in no acute distress,alert SKIN:  Color, texture, turgor normal. No rashes or lesions HEAD:  Normocephalic/atraumatic. CV:  RRR RESP: Normal respiratory effort MSK: no tenderness to palpation over occiput, neck, or shoulders  NEUROLOGICAL: Mental Status: Alert, oriented to person, place and time,Follows commands Cranial  Nerves: PERRL,visual fields intact to confrontation (no field cut but notes vision looks more blurred in left eye),extraocular movements intact,facial sensation intact,no facial droop or ptosis,hearing intact to finger rub bilaterally,no dysarthria,palate elevate symmetrically,tongue protrudes midline,shoulder shrug intact and symmetric Motor: muscle strength 5/5 both upper and lower extremities,no drift, normal tone Reflexes: 2+ throughout Sensation: intact to light touch all 4 extremities Coordination: Finger-to- nose-finger intact bilaterally,Heel-to-shin intact bilaterally Gait: normal-based   IMPRESSION: 35 year old female with a history of PCOS who presents for evaluation of migraines which have been worsening over the past year. Her headache pattern is consistent with migraine without aura, and she likely has a component of medication overuse headache in the setting of daily Advil use. Will start amitriptyline 25 mg QHS to help with headaches and sleep. For rescue will start Maxalt 10 mg PRN. Counseled on limiting OTC use to 2 days per week to avoid medication overuse headaches. Blurred vision OS noted on today's exam, will refer to ophthalmology for a formal eye exam.  PLAN: -Preventive: Start amitriptyline 25 mg QHS -Abortive: Start Maxalt 10 mg PRN -Counseled on limiting OTC use to 2 days per week or less to avoid rebound headaches -Referral to Ophthalmology for blurred vision  Headache education was done. Discussed lifestyle modification including increased oral hydration, decreased caffeine, exercise and stress management. Discussed treatment options including preventive and acute medications, natural supplements, and infusion therapy. Discussed medication overuse headache and to limit use of acute treatments to no more than 2 days/week or 10 days/month. Discussed medication side effects, adverse reactions and drug interactions. Written educational materials and patient instructions  outlining all of the above were given.  Follow-up: 3 months   30, MD 01/24/2021   3:53 PM

## 2021-01-25 ENCOUNTER — Telehealth: Payer: Self-pay | Admitting: Psychiatry

## 2021-01-25 NOTE — Telephone Encounter (Signed)
Sent to Dr. Groat ph # [phone] 

## 2021-05-10 ENCOUNTER — Telehealth: Payer: Self-pay | Admitting: Psychiatry

## 2021-05-10 ENCOUNTER — Other Ambulatory Visit: Payer: Self-pay | Admitting: Psychiatry

## 2021-05-10 ENCOUNTER — Ambulatory Visit: Payer: 59 | Admitting: Psychiatry

## 2021-05-10 MED ORDER — ZOLMITRIPTAN 5 MG PO TABS: 5.0000 mg | ORAL_TABLET | ORAL | 0 refills | Status: DC | PRN

## 2021-05-10 MED ORDER — METHYLPREDNISOLONE 4 MG PO TBPK: ORAL_TABLET | ORAL | 0 refills | Status: DC

## 2021-05-10 NOTE — Telephone Encounter (Signed)
Patient called back; I  informed her of 2 new Rx. We scheduled her for a follow up in Feb. I advised to call sooner as needed. Patient verbalized understanding, appreciation.

## 2021-05-10 NOTE — Telephone Encounter (Signed)
Pt requesting a call back from the nurse to discuss worsening migraines. She was supposed to see Dr. Delena Bali today but MD had to go home sick. Please advise at [phone].

## 2021-05-10 NOTE — Telephone Encounter (Signed)
Unable to reach patient.

## 2021-05-10 NOTE — Telephone Encounter (Signed)
I'll send in an rx for Zomig which she can use as acute treatment instead of rizatriptan. I'll also send in a steroid pack to help break her current migraine cycle

## 2021-05-10 NOTE — Telephone Encounter (Signed)
Patient called back, stated migraines have returned so she's taking rizatriptan. But its making her migraines worse and she has a headache every day. I advised will send to MD. Patient verbalized understanding, appreciation.

## 2021-05-10 NOTE — Progress Notes (Deleted)
° °  CC:  headaches  Follow-up Visit  Last visit: 01/24/21  Brief HPI: 36 year old female with a history of PCOS who follows in clinic for chronic migraines.  At her last visit she was noted to be overusing Advil and counseled on medication overuse headache. Amitriptyline was started for prevention and Maxalt was started for rescue. She was noted to have OS blurred vision and Ophthalmology referral was placed.  Interval History: Since her last visit***   Headache days per month: *** Headache free days per month: *** Headache severity: ***  Current Headache Regimen: Preventative: *** Abortive: ***  # of doses of abortive medications per month: ***  Prior Therapies                                  ***  Physical Exam:   Vital Signs: There were no vitals taken for this visit. GENERAL:  well appearing, in no acute distress, alert  SKIN:  Color, texture, turgor normal. No rashes or lesions HEAD:  Normocephalic/atraumatic. RESP: normal respiratory effort MSK:  No gross joint deformities.   NEUROLOGICAL: Mental Status: Alert, oriented to person, place and time, Follows commands, and Speech fluent and appropriate. Cranial Nerves: PERRL, face symmetric, no dysarthria, hearing grossly intact Motor: moves all extremities equally Gait: normal-based.  IMPRESSION: ***  PLAN: ***   Follow-up: ***  I spent a total of *** minutes on the date of the service. Headache education was done. Discussed lifestyle modification including increased oral hydration, decreased caffeine, exercise and stress management. Discussed treatment options including preventive and acute medications, natural supplements, and infusion therapy. Discussed medication overuse headache and to limit use of acute treatments to no more than 2 days/week or 10 days/month. Discussed medication side effects, adverse reactions and drug interactions. Written educational materials and patient instructions outlining all of  the above were given.  Ocie Doyne, MD

## 2021-05-10 NOTE — Telephone Encounter (Signed)
Called patient to get more information. No answer and VMB not set up.

## 2021-05-16 ENCOUNTER — Telehealth: Payer: Self-pay | Admitting: Psychiatry

## 2021-05-16 ENCOUNTER — Ambulatory Visit: Payer: 59 | Admitting: Psychiatry

## 2021-05-16 ENCOUNTER — Encounter: Payer: Self-pay | Admitting: Psychiatry

## 2021-05-16 ENCOUNTER — Other Ambulatory Visit: Payer: Self-pay | Admitting: Psychiatry

## 2021-05-16 ENCOUNTER — Telehealth: Payer: Self-pay | Admitting: *Deleted

## 2021-05-16 VITALS — BP 123/87 | HR 80 | Ht 64.0 in | Wt 140.0 lb

## 2021-05-16 DIAGNOSIS — R519 Headache, unspecified: Secondary | ICD-10-CM | POA: Diagnosis not present

## 2021-05-16 DIAGNOSIS — G43001 Migraine without aura, not intractable, with status migrainosus: Secondary | ICD-10-CM

## 2021-05-16 MED ORDER — DICLOFENAC POTASSIUM 50 MG PO TABS: 50.0000 mg | ORAL_TABLET | Freq: Three times a day (TID) | ORAL | 0 refills | Status: AC

## 2021-05-16 MED ORDER — KETOROLAC TROMETHAMINE 10 MG PO TABS: 10.0000 mg | ORAL_TABLET | Freq: Three times a day (TID) | ORAL | 0 refills | Status: DC

## 2021-05-16 MED ORDER — TOPIRAMATE 25 MG PO TABS: ORAL_TABLET | ORAL | 3 refills | Status: DC

## 2021-05-16 NOTE — Telephone Encounter (Signed)
Received call from [facility] asking if patient had Ketoralac injection in the office. I informed her the patient did not. She stated the manufacturer has a black box warning: recommends patient only start oral medication if they've had injection in the office first due to risk of GI bleeding.  I advised Melanie I;ll send message to MD and call her back. She  verbalized understanding, appreciation.

## 2021-05-16 NOTE — Patient Instructions (Addendum)
Start Topamax for headache prevention. Take 25 mg (1 pill) at bedtime for one week, then increase to 50 mg (2 pills) at bedtime for one week, then take 75 mg (3 pills) at bedtime for one week, then take 100 mg (4 pills) at bedtime Stop amitriptyline Take Toradol 10 mg three times a day for the next 5 days. Do not take ibuprofen or other NSAIDs with this medication

## 2021-05-16 NOTE — Telephone Encounter (Signed)
We can do 5 days of diclofenac instead. I sent an updated rx to her pharmacy

## 2021-05-16 NOTE — Telephone Encounter (Signed)
[employer] order sent to GI, they will obtain the auth and reach out to the patient to schedule.  ?

## 2021-05-16 NOTE — Progress Notes (Signed)
° °  CC:  headaches  Follow-up Visit  Last visit: 01/24/21  Brief HPI: 36 year old female with a history of PCOS who follows in clinic for chronic migraines.  At her last visit she was noted to be overusing Advil and was counseled on limiting OTC use. Amitriptyline 25 mg QHS was started for prevention and Maxalt was started for rescue. She was referred to Ophthalmology for blurred vision.  Interval History: Headaches are worse since her last appointment .  Amitriptyline helped temporarily but lost efficacy. Tried Maxalt which made her headache worse so she was prescribed Zomig. Zomig helped a little bit but didn't fully relieve the headache. Headaches seem worse now. They are constant and debilitating. Steroid pack was sent in last week, but this has not helped.  She had to reschedule her ophthalmology appointment for February.  Headache days per month: 30 Headache free days per month: 0  Current Headache Regimen: Preventative: amitriptyline 25 mg QHS Abortive: Zomig 5 mg PRN  Prior Therapies                                  Imitrex - made her sick Flexeril Amitriptyline 25 mg QHS - lack of efficacy Maxalt 10 mg prn Zomig 5 mg prn Medrol dosepak - ineffective  Physical Exam:   Vital Signs: BP 123/87    Pulse 80    Ht 5\' 4"  (1.626 m)    Wt 140 lb (63.5 kg)    BMI 24.03 kg/m  GENERAL:  well appearing, in no acute distress, alert  SKIN:  Color, texture, turgor normal. No rashes or lesions HEAD:  Normocephalic/atraumatic. RESP: normal respiratory effort MSK:  No gross joint deformities.   NEUROLOGICAL: Mental Status: Alert, oriented to person, place and time, Follows commands, and Speech fluent and appropriate. Cranial Nerves: PERRL, face symmetric, no dysarthria, hearing grossly intact Motor: moves all extremities equally Gait: normal-based.  IMPRESSION: 36 year old female with a history of PCOS who presents for follow up of migraines. Headaches have worsened despite  treatment with amitriptyline. Will order MRI brain to assess for secondary process given worsening constant headache despite treatment. 5 day course of Toradol prescribed to help break current headache. Will switch amitriptyline to Topamax for headache prevention.   PLAN: -MRI brain with contrast -Toradol 10 mg TID x 5 days to break current headache -Prevention: Start Topamax. Take 25 mg once daily; increase dose by 25 mg each week as tolerated -- up to 100 mg/day -next steps: consider occipital nerve block   Follow-up: 3 months  I spent a total of 25 minutes on the date of the service. Headache education was done. Discussed treatment options including preventive and acute medications. Discussed medication side effects, adverse reactions and drug interactions. Written educational materials and patient instructions outlining all of the above were given.  Genia Harold, MD 05/16/21 3:06 PM

## 2021-05-17 NOTE — Telephone Encounter (Signed)
Called [facility], spoke with [place] and informed her that Dr Delena Bali discontinued ketoralac and prescribed diclofenac. [place]  verbalized understanding, appreciation.

## 2021-05-27 ENCOUNTER — Other Ambulatory Visit: Payer: Self-pay

## 2021-05-27 ENCOUNTER — Ambulatory Visit
Admission: RE | Admit: 2021-05-27 | Discharge: 2021-05-27 | Disposition: A | Discharge status: Home / Self Care | Payer: 59 | Source: Ambulatory Visit | Attending: Psychiatry | Admitting: Psychiatry

## 2021-05-27 DIAGNOSIS — R519 Headache, unspecified: Secondary | ICD-10-CM

## 2021-05-27 MED ORDER — GADOBENATE DIMEGLUMINE 529 MG/ML IV SOLN
12.0000 mL | Freq: Once | INTRAVENOUS | Status: AC | PRN
  Administered 2021-05-27: 12 mL via INTRAVENOUS

## 2021-05-28 ENCOUNTER — Other Ambulatory Visit: Payer: Self-pay | Admitting: Psychiatry

## 2021-05-28 ENCOUNTER — Telehealth: Payer: Self-pay | Admitting: Psychiatry

## 2021-05-28 MED ORDER — CYCLOBENZAPRINE HCL 10 MG PO TABS: 5.0000 mg | ORAL_TABLET | Freq: Every evening | ORAL | 0 refills | Status: DC | PRN

## 2021-05-28 NOTE — Telephone Encounter (Signed)
She can try taking flexeril every night for the next 5 days and see if that helps. She could also come in for an occipital nerve block which might help with the neck/occiput pain

## 2021-05-28 NOTE — Telephone Encounter (Signed)
Rx sent to her pharmacy, thanks

## 2021-05-28 NOTE — Telephone Encounter (Signed)
Called patient who stated she had MRI yesterday, wasn't sure if we had the result yet. She stated since late Fri she's has been having constant throbbing and pain in neck, head and down her back.  It's gotten progressively worse. She doesn't have a migraine. Current dose of topamax is 50 mg until Thurs, then it'll be 75 mg nighty. I advised will send to Dr Delena Bali and give her a call back. Patient verbalized understanding, appreciation.

## 2021-05-28 NOTE — Telephone Encounter (Signed)
Pt called stating that she has been having a headache for the last several days and now is getting neck pains that go down her back and she would like to discuss. Please advise.

## 2021-05-28 NOTE — Telephone Encounter (Signed)
Called patient who wants to try flexeril, but she doesn't have any left from Rx written 09/2020. I advised will let Dr Delena Bali know. Patient verbalized understanding, appreciation.

## 2021-05-29 ENCOUNTER — Telehealth: Payer: Self-pay

## 2021-05-29 NOTE — Telephone Encounter (Signed)
Contacted pt, spoke to spouse per DPR. Informed him results were normal. Advised to call the office back with questions as he had none at the time and was appreciative. Did mention she may want to call us back when he gets home as she has something going on with her neck, informed him to just let us know.

## 2021-05-29 NOTE — Telephone Encounter (Signed)
-----   Message from Ocie Doyne, MD sent at 05/29/2021  8:27 AM EST ----- MRI brain is normal

## 2021-06-11 ENCOUNTER — Other Ambulatory Visit: Payer: Self-pay | Admitting: Psychiatry

## 2021-06-19 ENCOUNTER — Ambulatory Visit: Payer: 59 | Admitting: Psychiatry

## 2021-06-19 ENCOUNTER — Other Ambulatory Visit: Payer: Self-pay | Admitting: Psychiatry

## 2021-06-19 ENCOUNTER — Telehealth: Payer: Self-pay | Admitting: Psychiatry

## 2021-06-19 MED ORDER — TOPIRAMATE 50 MG PO TABS: ORAL_TABLET | ORAL | 3 refills | Status: AC

## 2021-06-19 NOTE — Telephone Encounter (Signed)
I called the pt back, she reports she has not had any side effects since starting the topamax and she is agreeable to trying the additional 25 mg in the am for 1 week then increasing to the two in the morning the next week. She reports she is due for a refill and is agreeable to the 50 mg being sent to [facility] [address]

## 2021-06-19 NOTE — Telephone Encounter (Signed)
If she is not having any side effects with Topamax she can start taking 25 mg (1 pill) in the morning in addition to her 4 pills at night for one week, then increase to 50 mg (2 pills) in the morning plus 4 pills at night. If she's agreeable to this I can send in 50 mg tablets so she doesn't have to take so many pills per day

## 2021-06-19 NOTE — Telephone Encounter (Signed)
I called the pt, she sts migraine frequency has improved but daily headaches are still a issue for her.  Reports she has been on to Topamax 25 mg 4 pills at bed time for 1.5 weeks now and would like to know if any adjustments could be made?

## 2021-06-19 NOTE — Telephone Encounter (Signed)
Pt is asking for a call to discuss  that she is still having headaches, pt asking to be called at work [phone]

## 2021-06-19 NOTE — Telephone Encounter (Signed)
Rx sent to her pharmacy, thanks

## 2021-07-08 DIAGNOSIS — H109 Unspecified conjunctivitis: Secondary | ICD-10-CM | POA: Diagnosis not present

## 2021-07-18 ENCOUNTER — Encounter: Payer: Self-pay | Admitting: Psychiatry

## 2021-07-18 ENCOUNTER — Ambulatory Visit: Payer: 59 | Admitting: Psychiatry

## 2021-07-18 VITALS — BP 116/80 | HR 84 | Ht 64.0 in | Wt 139.0 lb

## 2021-07-18 DIAGNOSIS — G43119 Migraine with aura, intractable, without status migrainosus: Secondary | ICD-10-CM

## 2021-07-18 MED ORDER — AIMOVIG 70 MG/ML ~~LOC~~ SOAJ: 70.0000 mg | Freq: Once | SUBCUTANEOUS | 0 refills | Status: AC

## 2021-07-18 MED ORDER — NARATRIPTAN HCL 2.5 MG PO TABS: 2.5000 mg | ORAL_TABLET | ORAL | 3 refills | Status: AC | PRN

## 2021-07-18 MED ORDER — AIMOVIG 70 MG/ML ~~LOC~~ SOAJ: 70.0000 mg | SUBCUTANEOUS | 3 refills | Status: DC

## 2021-07-18 NOTE — Patient Instructions (Signed)
Start Aimovig monthly for headache prevention ?Start naratriptan as needed for migraine. Take at the onset of migraine. If headache recurs or does not fully resolve, you may take a second dose after 4 hours. Please avoid taking more than 2 days per week ?

## 2021-07-18 NOTE — Progress Notes (Signed)
? ?  CC:  headaches ? ?Follow-up Visit ? ?Last visit: 05/06/21 ? ?Brief HPI: ?36 year old female with a history of PCOS who follows in clinic for chronic migraines. ? ?Headaches had worsened at her last visit and MRI brain was ordered. She was started on Topamax for headache prevention. ? ?Interval History: ?Topamax 50/100 did not seem to make much a difference. She continues to have daily headaches, but now they are not always debilitating. She is currently having daily lower level headaches with more severe migraines 2-3 times per week. Headache does improve somewhat with Zomig eventually, but it temporarily makes her headache feel worse. Flexeril makes her drowsy so she tries not to take it. She is not sure if the Flexeril helps. ? ?Hasn't been able to see ophthalmology because she was sick at the time of her last appointment. She is working on rescheduling this. ? ?MRI brain was unremarkable. ? ?Headache days per month: 30 ?Headache free days per month: 0 ? ?Current Headache Regimen: ?Preventative: Topamax 50/100 ?Abortive: Zomig ? ? ?Prior Therapies                                  ?Imitrex - made her sick ?Flexeril ?Amitriptyline 25 mg QHS - lack of efficacy ?Topamax 50/100 ?Maxalt 10 mg prn ?Zomig 5 mg prn ?Medrol dosepak - ineffective ? ?Physical Exam:  ? ?Vital Signs: ?BP 116/80   Pulse 84   Ht 5\' 4"  (1.626 m)   Wt 139 lb (63 kg)   BMI 23.86 kg/m?  ?GENERAL:  well appearing, in no acute distress, alert  ?SKIN:  Color, texture, turgor normal. No rashes or lesions ?HEAD:  Normocephalic/atraumatic. ?RESP: normal respiratory effort ?MSK:  No gross joint deformities.  ? ?NEUROLOGICAL: ?Mental Status: Alert, oriented to person, place and time, Follows commands, and Speech fluent and appropriate. ?Cranial Nerves: PERRL, face symmetric, no dysarthria, hearing grossly intact ?Motor: moves all extremities equally ?Gait: normal-based. ? ?IMPRESSION: ?36 year old female with a history of PCOS who follows in clinic for  chronic migraine. She continues to have daily headaches despite treatment with Topamax. Will start [place] for headache prevention. Zomig helps but causes side effects. Will start naratriptan for rescue. ? ?PLAN: ?-Prevention: Start Aimovig 70 mg monthly (sample given in office today). Continue Topamax 50/100 mg for now, consider weaning once headaches stable on Aimovig ?-Rescue: Start naratriptan for rescue ?-next steps: consider gepant for rescue, Botox, propranolol ? ?Follow-up: 4 months ? ?I spent a total of 26 minutes on the date of the service. Discussed treatment options including preventive and acute medications.  Discussed medication side effects, adverse reactions and drug interactions. Written educational materials and patient instructions outlining all of the above were given. ? ?Genia Harold, MD ?07/18/21 ?3:25 PM ? ?

## 2021-11-05 ENCOUNTER — Other Ambulatory Visit: Payer: Self-pay | Admitting: Psychiatry

## 2021-11-05 ENCOUNTER — Telehealth: Payer: Self-pay | Admitting: Psychiatry

## 2021-11-05 MED ORDER — DICLOFENAC POTASSIUM 50 MG PO TABS: 50.0000 mg | ORAL_TABLET | Freq: Three times a day (TID) | ORAL | 0 refills | Status: AC

## 2021-11-05 MED ORDER — AIMOVIG 70 MG/ML ~~LOC~~ SOAJ: 70.0000 mg | SUBCUTANEOUS | 3 refills | Status: AC

## 2021-11-05 MED ORDER — UBRELVY 100 MG PO TABS: 100.0000 mg | ORAL_TABLET | ORAL | 6 refills | Status: DC | PRN

## 2021-11-05 NOTE — Telephone Encounter (Signed)
Pt has called Megan,RN back. 

## 2021-11-05 NOTE — Telephone Encounter (Signed)
Pt said, may need to change medication, last 2 wks getting headaches, now having migraines. Also requesting a letter to be excused from jury duty due to having migraines. Would like a call from the nurse.

## 2021-11-05 NOTE — Telephone Encounter (Signed)
I sent in a prescription for Ubrelvy to see if this will work better for rescue than the naratriptan. It may take some time to get approved by insurance, so in the meantime I sent a 5 day course of diclofenac to her pharmacy to help break her current headache. I can sign a jury duty excuse for her if needed

## 2021-11-05 NOTE — Telephone Encounter (Signed)
I returned the pt's call. She reports over the weekend her h/a//migraines have been uncontrollable. Since late Friday, pt reports a pounding/throbbing pain at the back of her head that radiates down her neck and into her shoulder blades/upper back.   She confirmed she is currently taking the aimovig 70 mg last injection was about a month ago ( next due this week) and the naratriptan PRN. She reports the Naratriptan helps some but does not take the pain away fully.   She has been off the topamax now for several months, and is wondering if MD has any recommendations to help break this cycle?  She is also asking for a jury duty excuse since her migraines/h/a have been uncontrollable. She is scheduled for Jury duty on 11/13/2021.  Will send to MD for review.

## 2021-11-05 NOTE — Addendum Note (Signed)
Addended by: Ann Maki on: 11/05/2021 05:06 PM   Modules accepted: Orders

## 2021-11-05 NOTE — Telephone Encounter (Signed)
I called pt back and we discussed recommendation from Dr. Delena Bali. Pt was agreeable to advice and will try and pick the diclofenac up tonight or tomorrow. I advised I would work on the PA for [employer] and the jury note.  She requested the jury note be e-mailed to [email or web address]

## 2021-11-05 NOTE — Telephone Encounter (Signed)
I attempted to call the pt and review plan. Pt was not available and voicemail was full and not accepting new messages.  Will try again at a later time.

## 2021-11-06 NOTE — Telephone Encounter (Signed)
I have completed the fax form for pt's ubrelvy medication.   Have placed on MD's desk for review/signature if agreeable.

## 2021-11-06 NOTE — Telephone Encounter (Signed)
I have formulated the letter for MD to review and sign if agreeable.

## 2021-11-06 NOTE — Telephone Encounter (Signed)
Letter has been signed and e-mailed to requested address. Working on [employer] for [employer], cmm could not verify benefit will have to call plan directly.

## 2021-11-08 NOTE — Telephone Encounter (Signed)
Received letter form [facility], [employer] approved 11/08/21 - 11/09/22. Approval letter faxed to pharmacy.

## 2021-11-27 DIAGNOSIS — N912 Amenorrhea, unspecified: Secondary | ICD-10-CM | POA: Diagnosis not present

## 2021-12-06 ENCOUNTER — Ambulatory Visit: Payer: 59 | Admitting: Psychiatry

## 2021-12-06 ENCOUNTER — Telehealth: Payer: Self-pay | Admitting: Psychiatry

## 2021-12-06 NOTE — Telephone Encounter (Signed)
Pt cancelled appt due to unable to get off work.

## 2021-12-17 DIAGNOSIS — L03114 Cellulitis of left upper limb: Secondary | ICD-10-CM | POA: Diagnosis not present

## 2021-12-17 DIAGNOSIS — T63481A Toxic effect of venom of other arthropod, accidental (unintentional), initial encounter: Secondary | ICD-10-CM | POA: Diagnosis not present

## 2022-01-31 DIAGNOSIS — Z319 Encounter for procreative management, unspecified: Secondary | ICD-10-CM | POA: Diagnosis not present

## 2022-02-12 DIAGNOSIS — N946 Dysmenorrhea, unspecified: Secondary | ICD-10-CM | POA: Diagnosis not present

## 2022-02-12 DIAGNOSIS — R300 Dysuria: Secondary | ICD-10-CM | POA: Diagnosis not present

## 2022-03-01 DIAGNOSIS — Z319 Encounter for procreative management, unspecified: Secondary | ICD-10-CM | POA: Diagnosis not present

## 2022-04-23 DIAGNOSIS — E282 Polycystic ovarian syndrome: Secondary | ICD-10-CM | POA: Diagnosis not present

## 2022-04-23 DIAGNOSIS — N915 Oligomenorrhea, unspecified: Secondary | ICD-10-CM | POA: Diagnosis not present

## 2022-04-23 DIAGNOSIS — Z319 Encounter for procreative management, unspecified: Secondary | ICD-10-CM | POA: Diagnosis not present

## 2022-04-23 DIAGNOSIS — N970 Female infertility associated with anovulation: Secondary | ICD-10-CM | POA: Diagnosis not present

## 2022-05-14 DIAGNOSIS — Z3200 Encounter for pregnancy test, result unknown: Secondary | ICD-10-CM | POA: Diagnosis not present

## 2022-05-14 DIAGNOSIS — Z3689 Encounter for other specified antenatal screening: Secondary | ICD-10-CM | POA: Diagnosis not present

## 2022-05-14 DIAGNOSIS — N979 Female infertility, unspecified: Secondary | ICD-10-CM | POA: Diagnosis not present

## 2022-05-16 DIAGNOSIS — N979 Female infertility, unspecified: Secondary | ICD-10-CM | POA: Diagnosis not present

## 2022-06-12 DIAGNOSIS — Z3201 Encounter for pregnancy test, result positive: Secondary | ICD-10-CM | POA: Diagnosis not present

## 2022-06-17 ENCOUNTER — Encounter (HOSPITAL_BASED_OUTPATIENT_CLINIC_OR_DEPARTMENT_OTHER): Payer: Self-pay | Admitting: Obstetrics & Gynecology

## 2022-06-17 ENCOUNTER — Other Ambulatory Visit: Payer: Self-pay | Admitting: Obstetrics & Gynecology

## 2022-06-17 DIAGNOSIS — O029 Abnormal product of conception, unspecified: Secondary | ICD-10-CM | POA: Diagnosis not present

## 2022-06-17 NOTE — H&P (Signed)
Carol Foster is an 37 y.o. female with failed pregnancy at 6-7 weeks. Sono on 2/19 noted no interval growth, IUP with double bleb, no fetal pole or yolk sac. Pt declined Cytotec and office IPAS due to pain at prior episode with SAB.  Rh pos  AMA, this was conception with OI medication Letrozole   No LMP recorded. (Menstrual status: Other).    Past Medical History:  Diagnosis Date   History of gestational diabetes 2012   Migraines    neurologist--- dr j. Billey Gosling   Polycystic ovarian syndrome     Past Surgical History:  Procedure Laterality Date   NO PAST SURGERIES     WISDOM TOOTH EXTRACTION      Family History  Problem Relation Age of Onset   Migraines Mother    Breast cancer Mother    Alzheimer's disease Maternal Grandfather    Diabetes Paternal Uncle     Social History:  reports that she has never smoked. She has never used smokeless tobacco. She reports that she does not currently use alcohol. She reports that she does not use drugs.  Allergies:  Allergies  Allergen Reactions   Imitrex [Sumatriptan] Other (See Comments)    Did not feel well when taken    No medications prior to admission.    Review of Systems  Height 5' 4"$  (1.626 m), weight 64 kg, unknown if currently breastfeeding. Physical Exam Physical exam:  A&O x 3, no acute distress. Pleasant HEENT neg, no thyromegaly Lungs CTA bilat CV RRR, S1S2 normal Abdo soft, non tender, non acute Extr no edema/ tenderness Pelvic deferred to OR   Assessment/Plan: 37 yo female with early pregnancy failure, no fetal pole at 7 weeks. Here for suction dilatation and evacuation Risks/complications of surgery reviewed incl infection, bleeding, damage to internal organs including bladder, bowels, ureters, blood vessels, other risks from anesthesia, VTE and delayed complications of any surgery, complications in future surgery reviewed.   Elveria Royals 06/17/2022, 11:52 PM

## 2022-06-17 NOTE — Progress Notes (Signed)
Spoke w/ via phone for pre-op interview--- pt Lab needs dos---- no (per anes)  /  looks when orders are second signed md orders cbc               Lab results------ no COVID test -----patient states asymptomatic no test needed Arrive at ------- 0530 on 06-18-2022 NPO after MN  Med rec completed Medications to take morning of surgery ----- none Diabetic medication ----- n/a Patient instructed no nail polish to be worn day of surgery Patient instructed to bring photo id and insurance card day of surgery Patient aware to have Driver (ride ) / caregiver    for 24 hours after surgery -- husband, tyler Patient Special Instructions ----- n/a Pre-Op special Istructions ----- pre-op orders pending second sign Patient verbalized understanding of instructions that were given at this phone interview. Patient denies shortness of breath, chest pain, fever, cough at this phone interview.

## 2022-06-18 ENCOUNTER — Encounter (HOSPITAL_BASED_OUTPATIENT_CLINIC_OR_DEPARTMENT_OTHER)
Admission: RE | Disposition: A | Discharge status: Home / Self Care | Payer: Self-pay | Source: Ambulatory Visit | Attending: Obstetrics & Gynecology

## 2022-06-18 ENCOUNTER — Ambulatory Visit (HOSPITAL_BASED_OUTPATIENT_CLINIC_OR_DEPARTMENT_OTHER): Payer: 59 | Admitting: Anesthesiology

## 2022-06-18 ENCOUNTER — Other Ambulatory Visit: Payer: Self-pay

## 2022-06-18 ENCOUNTER — Ambulatory Visit (HOSPITAL_BASED_OUTPATIENT_CLINIC_OR_DEPARTMENT_OTHER)
Admission: RE | Admit: 2022-06-18 | Discharge: 2022-06-18 | Disposition: A | Discharge status: Home / Self Care | Payer: 59 | Source: Ambulatory Visit | Attending: Obstetrics & Gynecology | Admitting: Obstetrics & Gynecology

## 2022-06-18 ENCOUNTER — Encounter (HOSPITAL_BASED_OUTPATIENT_CLINIC_OR_DEPARTMENT_OTHER): Payer: Self-pay | Admitting: Obstetrics & Gynecology

## 2022-06-18 DIAGNOSIS — E282 Polycystic ovarian syndrome: Secondary | ICD-10-CM | POA: Insufficient documentation

## 2022-06-18 DIAGNOSIS — O99281 Endocrine, nutritional and metabolic diseases complicating pregnancy, first trimester: Secondary | ICD-10-CM | POA: Insufficient documentation

## 2022-06-18 DIAGNOSIS — O021 Missed abortion: Secondary | ICD-10-CM | POA: Diagnosis not present

## 2022-06-18 DIAGNOSIS — O039 Complete or unspecified spontaneous abortion without complication: Secondary | ICD-10-CM | POA: Diagnosis present

## 2022-06-18 DIAGNOSIS — Z01818 Encounter for other preprocedural examination: Secondary | ICD-10-CM

## 2022-06-18 DIAGNOSIS — Z3A01 Less than 8 weeks gestation of pregnancy: Secondary | ICD-10-CM

## 2022-06-18 HISTORY — DX: Migraine, unspecified, not intractable, without status migrainosus: G43.909

## 2022-06-18 HISTORY — PX: DILATION AND EVACUATION: SHX1459

## 2022-06-18 LAB — CBC
HCT: 40.9 % (ref 36.0–46.0)
Hemoglobin: 14.1 g/dL (ref 12.0–15.0)
MCH: 31.9 pg (ref 26.0–34.0)
MCHC: 34.5 g/dL (ref 30.0–36.0)
MCV: 92.5 fL (ref 80.0–100.0)
Platelets: 211 10*3/uL (ref 150–400)
RBC: 4.42 MIL/uL (ref 3.87–5.11)
RDW: 12.8 % (ref 11.5–15.5)
WBC: 5.6 10*3/uL (ref 4.0–10.5)
nRBC: 0 % (ref 0.0–0.2)

## 2022-06-18 SURGERY — DILATION AND EVACUATION, UTERUS
Anesthesia: General | Site: Vagina

## 2022-06-18 MED ORDER — KETOROLAC TROMETHAMINE 30 MG/ML IJ SOLN
INTRAMUSCULAR | Status: DC | PRN
  Administered 2022-06-18: 30 mg via INTRAVENOUS

## 2022-06-18 MED ORDER — TRANEXAMIC ACID-NACL 1000-0.7 MG/100ML-% IV SOLN
INTRAVENOUS | Status: AC
  Filled 2022-06-18: qty 100

## 2022-06-18 MED ORDER — ONDANSETRON HCL 4 MG/2ML IJ SOLN
INTRAMUSCULAR | Status: AC
  Filled 2022-06-18: qty 2

## 2022-06-18 MED ORDER — ACETAMINOPHEN 10 MG/ML IV SOLN: 1000.0000 mg | Freq: Once | INTRAVENOUS | Status: DC | PRN

## 2022-06-18 MED ORDER — LIDOCAINE HCL (PF) 2 % IJ SOLN
INTRAMUSCULAR | Status: AC
  Filled 2022-06-18: qty 5

## 2022-06-18 MED ORDER — POVIDONE-IODINE 10 % EX SWAB: 2.0000 | Freq: Once | CUTANEOUS | Status: DC

## 2022-06-18 MED ORDER — MIDAZOLAM HCL 5 MG/5ML IJ SOLN
INTRAMUSCULAR | Status: DC | PRN
  Administered 2022-06-18: 2 mg via INTRAVENOUS

## 2022-06-18 MED ORDER — PROMETHAZINE HCL 25 MG/ML IJ SOLN: 6.2500 mg | INTRAMUSCULAR | Status: DC | PRN

## 2022-06-18 MED ORDER — PROPOFOL 10 MG/ML IV BOLUS
INTRAVENOUS | Status: AC
  Filled 2022-06-18: qty 20

## 2022-06-18 MED ORDER — KETOROLAC TROMETHAMINE 30 MG/ML IJ SOLN: 30.0000 mg | Freq: Once | INTRAMUSCULAR | Status: DC

## 2022-06-18 MED ORDER — CHLOROPROCAINE HCL 1 % IJ SOLN
INTRAMUSCULAR | Status: DC | PRN
  Administered 2022-06-18: 20 mL

## 2022-06-18 MED ORDER — LIDOCAINE 2% (20 MG/ML) 5 ML SYRINGE
INTRAMUSCULAR | Status: DC | PRN
  Administered 2022-06-18: 100 mg via INTRAVENOUS

## 2022-06-18 MED ORDER — AMISULPRIDE (ANTIEMETIC) 5 MG/2ML IV SOLN: 10.0000 mg | Freq: Once | INTRAVENOUS | Status: DC | PRN

## 2022-06-18 MED ORDER — PHENYLEPHRINE 80 MCG/ML (10ML) SYRINGE FOR IV PUSH (FOR BLOOD PRESSURE SUPPORT)
PREFILLED_SYRINGE | INTRAVENOUS | Status: AC
  Filled 2022-06-18: qty 10

## 2022-06-18 MED ORDER — ARTIFICIAL TEARS OPHTHALMIC OINT
TOPICAL_OINTMENT | OPHTHALMIC | Status: AC
  Filled 2022-06-18: qty 3.5

## 2022-06-18 MED ORDER — OXYCODONE HCL 5 MG PO TABS: 5.0000 mg | ORAL_TABLET | Freq: Once | ORAL | Status: DC | PRN

## 2022-06-18 MED ORDER — FENTANYL CITRATE (PF) 100 MCG/2ML IJ SOLN
INTRAMUSCULAR | Status: DC | PRN
  Administered 2022-06-18: 50 ug via INTRAVENOUS

## 2022-06-18 MED ORDER — MIDAZOLAM HCL 2 MG/2ML IJ SOLN
INTRAMUSCULAR | Status: AC
  Filled 2022-06-18: qty 2

## 2022-06-18 MED ORDER — FENTANYL CITRATE (PF) 100 MCG/2ML IJ SOLN
INTRAMUSCULAR | Status: AC
  Filled 2022-06-18: qty 2

## 2022-06-18 MED ORDER — IBUPROFEN 200 MG PO TABS: 600.0000 mg | ORAL_TABLET | Freq: Four times a day (QID) | ORAL | 0 refills | Status: AC | PRN

## 2022-06-18 MED ORDER — KETOROLAC TROMETHAMINE 30 MG/ML IJ SOLN
INTRAMUSCULAR | Status: AC
  Filled 2022-06-18: qty 1

## 2022-06-18 MED ORDER — FENTANYL CITRATE (PF) 100 MCG/2ML IJ SOLN: 25.0000 ug | INTRAMUSCULAR | Status: DC | PRN

## 2022-06-18 MED ORDER — ACETAMINOPHEN 500 MG PO TABS: 500.0000 mg | ORAL_TABLET | Freq: Four times a day (QID) | ORAL | 0 refills | Status: AC | PRN

## 2022-06-18 MED ORDER — CEFAZOLIN SODIUM-DEXTROSE 2-4 GM/100ML-% IV SOLN
2.0000 g | INTRAVENOUS | Status: AC
  Administered 2022-06-18: 2 g via INTRAVENOUS

## 2022-06-18 MED ORDER — LACTATED RINGERS IV SOLN: INTRAVENOUS | Status: DC

## 2022-06-18 MED ORDER — CEFAZOLIN SODIUM-DEXTROSE 2-4 GM/100ML-% IV SOLN
INTRAVENOUS | Status: AC
  Filled 2022-06-18: qty 100

## 2022-06-18 MED ORDER — OXYCODONE HCL 5 MG/5ML PO SOLN: 5.0000 mg | Freq: Once | ORAL | Status: DC | PRN

## 2022-06-18 MED ORDER — PROPOFOL 10 MG/ML IV BOLUS
INTRAVENOUS | Status: DC | PRN
  Administered 2022-06-18: 60 mg via INTRAVENOUS
  Administered 2022-06-18: 200 mg via INTRAVENOUS
  Administered 2022-06-18: 50 mg via INTRAVENOUS

## 2022-06-18 MED ORDER — PHENYLEPHRINE 80 MCG/ML (10ML) SYRINGE FOR IV PUSH (FOR BLOOD PRESSURE SUPPORT)
PREFILLED_SYRINGE | INTRAVENOUS | Status: DC | PRN
  Administered 2022-06-18: 80 ug via INTRAVENOUS

## 2022-06-18 MED ORDER — ONDANSETRON HCL 4 MG/2ML IJ SOLN
INTRAMUSCULAR | Status: DC | PRN
  Administered 2022-06-18: 4 mg via INTRAVENOUS

## 2022-06-18 MED ORDER — DEXAMETHASONE SODIUM PHOSPHATE 10 MG/ML IJ SOLN
INTRAMUSCULAR | Status: AC
  Filled 2022-06-18: qty 1

## 2022-06-18 MED ORDER — DEXAMETHASONE SODIUM PHOSPHATE 10 MG/ML IJ SOLN
INTRAMUSCULAR | Status: DC | PRN
  Administered 2022-06-18: 5 mg via INTRAVENOUS

## 2022-06-18 MED ORDER — 0.9 % SODIUM CHLORIDE (POUR BTL) OPTIME
TOPICAL | Status: DC | PRN
  Administered 2022-06-18: 500 mL

## 2022-06-18 SURGICAL SUPPLY — 21 items
DRSG TELFA 3X8 NADH STRL (GAUZE/BANDAGES/DRESSINGS) ×1 IMPLANT
GAUZE 4X4 16PLY ~~LOC~~+RFID DBL (SPONGE) ×1 IMPLANT
GLOVE BIO SURGEON STRL SZ7 (GLOVE) ×1 IMPLANT
GLOVE BIOGEL PI IND STRL 7.0 (GLOVE) ×2 IMPLANT
GOWN STRL REUS W/TWL LRG LVL3 (GOWN DISPOSABLE) ×2 IMPLANT
KIT BERKELEY 1ST TRI 3/8 NO TR (MISCELLANEOUS) ×1 IMPLANT
KIT BERKELEY 1ST TRIMESTER 3/8 (MISCELLANEOUS) ×1 IMPLANT
KIT TURNOVER CYSTO (KITS) ×1 IMPLANT
NS IRRIG 1000ML POUR BTL (IV SOLUTION) ×1 IMPLANT
NS IRRIG 500ML POUR BTL (IV SOLUTION) IMPLANT
PACK VAGINAL MINOR WOMEN LF (CUSTOM PROCEDURE TRAY) ×1 IMPLANT
PAD OB MATERNITY 4.3X12.25 (PERSONAL CARE ITEMS) ×1 IMPLANT
PAD PREP 24X48 CUFFED NSTRL (MISCELLANEOUS) ×1 IMPLANT
SET BERKELEY SUCTION TUBING (SUCTIONS) ×1 IMPLANT
SLEEVE SCD COMPRESS KNEE MED (STOCKING) ×1 IMPLANT
SPIKE FLUID TRANSFER (MISCELLANEOUS) ×1 IMPLANT
TOWEL OR 17X26 10 PK STRL BLUE (TOWEL DISPOSABLE) ×2 IMPLANT
VACURETTE 10 RIGID CVD (CANNULA) IMPLANT
VACURETTE 7MM CVD STRL WRAP (CANNULA) IMPLANT
VACURETTE 8 RIGID CVD (CANNULA) IMPLANT
VACURETTE 9 RIGID CVD (CANNULA) IMPLANT

## 2022-06-18 NOTE — Anesthesia Preprocedure Evaluation (Addendum)
Anesthesia Evaluation  Patient identified by MRN, date of birth, ID band Patient awake    Reviewed: Allergy & Precautions, NPO status , Patient's Chart, lab work & pertinent test results  Airway Mallampati: II  TM Distance: >3 FB Neck ROM: Full    Dental no notable dental hx.    Pulmonary neg pulmonary ROS   Pulmonary exam normal        Cardiovascular negative cardio ROS Normal cardiovascular exam     Neuro/Psych  Headaches  negative psych ROS   GI/Hepatic negative GI ROS, Neg liver ROS,,,  Endo/Other  negative endocrine ROS  Polycystic ovarian syndrome  Renal/GU negative Renal ROS     Musculoskeletal negative musculoskeletal ROS (+)    Abdominal   Peds  Hematology negative hematology ROS (+)   Anesthesia Other Findings Missed Abortion  Reproductive/Obstetrics                             Anesthesia Physical Anesthesia Plan  ASA: 1  Anesthesia Plan: General   Post-op Pain Management:    Induction: Intravenous  PONV Risk Score and Plan: 4 or greater and Ondansetron, Dexamethasone, Midazolam and Treatment may vary due to age or medical condition  Airway Management Planned: LMA  Additional Equipment:   Intra-op Plan:   Post-operative Plan: Extubation in OR  Informed Consent: I have reviewed the patients History and Physical, chart, labs and discussed the procedure including the risks, benefits and alternatives for the proposed anesthesia with the patient or authorized representative who has indicated his/her understanding and acceptance.     Dental advisory given  Plan Discussed with: CRNA  Anesthesia Plan Comments:         Anesthesia Quick Evaluation

## 2022-06-18 NOTE — Op Note (Signed)
06/18/2022 Preoperative diagnosis: 7 weeks non-viable pregnancy with absent fetal pole  Postop diagnosis: as above.  Procedure: Suction dilatation and evacuation  Anesthesia General LMA and local paracervical block  Surgeon: Azucena Fallen, MD Assistant: none  IV fluids 600 cc LR Estimated blood loss 20 cc Urine output:  voided preop Complications none Condition stable Disposition PACU  Specimen: products of conception sent to pathology.  Procedure  Indication: Pregnancy failure at [redacted] weeks gestation with absent fetal pole. Decision was made to proceed with suction D&E per patient's choice for pain control. . Risks/ complications of surgery including infection, bleeding, damage to internal organs including uterine perforation, Asherman syndrome were reviewed. She voiced understanding and gave informed written consent.  Patient was brought to the operating room with IV running. She received preop 2 gm Ancef . She underwent general endotracheal anesthesia without complications. She was given dorsolithotomy position. Parts were prepped and draped in standard fashion.   Bimanual exam revealed uterus to be anteverted and 8 week size.  Speculum was placed and cervix was grasped with single-tooth tenaculum. The uterus was sounded to 8 cm. Cervical os was dilated to 21  [place] dilator. A #7 suction curette was introduced in the uterine cavity and suction evacuation of products of conception was done in multiple step wise fashion. Gentle sharp curettage was performed. Suction cannula was reintroduced and cavity was emptied. At this point the procedure was complete.  All counts are correct x2. Specimen products of conception. No complications. Patient was made supine dorsal anesthesia and brought to the recovery room in stable condition.  Patient will be discharged home today and follow up in 2 weeks in office. Warning signs of infection and excessive bleeding reviewed. I performed this surgery.    Azucena Fallen MD  [place]

## 2022-06-18 NOTE — Anesthesia Procedure Notes (Signed)
Procedure Name: LMA Insertion Date/Time: 06/18/2022 7:32 AM  Performed by: Rogers Blocker, CRNAPre-anesthesia Checklist: Patient identified, Emergency Drugs available, Suction available and Patient being monitored Patient Re-evaluated:Patient Re-evaluated prior to induction Oxygen Delivery Method: Circle System Utilized Preoxygenation: Pre-oxygenation with 100% oxygen Induction Type: IV induction Ventilation: Mask ventilation without difficulty LMA: LMA inserted LMA Size: 3.0 Number of attempts: 1 Airway Equipment and Method: Bite block Placement Confirmation: positive ETCO2 Tube secured with: Tape Dental Injury: Teeth and Oropharynx as per pre-operative assessment

## 2022-06-18 NOTE — Anesthesia Postprocedure Evaluation (Signed)
Anesthesia Post Note  Patient: Carol Foster  Procedure(s) Performed: Suction DILATATION AND EVACUATION (Vagina )     Patient location during evaluation: PACU Anesthesia Type: General Level of consciousness: awake Pain management: pain level controlled Vital Signs Assessment: post-procedure vital signs reviewed and stable Respiratory status: spontaneous breathing, nonlabored ventilation and respiratory function stable Cardiovascular status: blood pressure returned to baseline and stable Postop Assessment: no apparent nausea or vomiting Anesthetic complications: no   No notable events documented.  Last Vitals:  Vitals:   06/18/22 0845 06/18/22 0908  BP: 107/78 106/77  Pulse: 75 78  Resp: 12 16  Temp:  36.5 C  SpO2: 96% 100%    Last Pain:  Vitals:   06/18/22 0908  TempSrc:   PainSc: 0-No pain                 Sojourner Behringer P Jehu Mccauslin

## 2022-06-18 NOTE — Transfer of Care (Signed)
Immediate Anesthesia Transfer of Care Note  Patient: Carol Foster  Procedure(s) Performed: Suction DILATATION AND EVACUATION (Vagina )  Patient Location: PACU  Anesthesia Type:General  Level of Consciousness: drowsy and responds to stimulation  Airway & Oxygen Therapy: Patient Spontanous Breathing  Post-op Assessment: Report given to RN and Post -op Vital signs reviewed and stable  Post vital signs: Reviewed and stable  Last Vitals:  Vitals Value Taken Time  BP 101/75 06/18/22 0801  Temp    Pulse 82 06/18/22 0804  Resp 16 06/18/22 0804  SpO2 97 % 06/18/22 0804  Vitals shown include unvalidated device data.  Last Pain:  Vitals:   06/18/22 0603  TempSrc: Oral  PainSc: 4       Patients Stated Pain Goal: 4 [phone]  Complications: No notable events documented.

## 2022-06-18 NOTE — Discharge Instructions (Signed)
  Post Anesthesia Home Care Instructions  Activity: Get plenty of rest for the remainder of the day. A responsible individual must stay with you for 24 hours following the procedure.  For the next 24 hours, DO NOT: -Drive a car -Operate machinery -Drink alcoholic beverages -Take any medication unless instructed by your physician -Make any legal decisions or sign important papers.  Meals: Start with liquid foods such as gelatin or soup. Progress to regular foods as tolerated. Avoid greasy, spicy, heavy foods. If nausea and/or vomiting occur, drink only clear liquids until the nausea and/or vomiting subsides. Call your physician if vomiting continues.  Special Instructions/Symptoms: Your throat may feel dry or sore from the anesthesia or the breathing tube placed in your throat during surgery. If this causes discomfort, gargle with warm salt water. The discomfort should disappear within 24 hours.  If you had a scopolamine patch placed behind your ear for the management of post- operative nausea and/or vomiting:  1. The medication in the patch is effective for 72 hours, after which it should be removed.  Wrap patch in a tissue and discard in the trash. Wash hands thoroughly with soap and water. 2. You may remove the patch earlier than 72 hours if you experience unpleasant side effects which may include dry mouth, dizziness or visual disturbances. 3. Avoid touching the patch. Wash your hands with soap and water after contact with the patch.    Post Anesthesia Home Care Instructions  Activity: Get plenty of rest for the remainder of the day. A responsible individual must stay with you for 24 hours following the procedure.  For the next 24 hours, DO NOT: -Drive a car -Operate machinery -Drink alcoholic beverages -Take any medication unless instructed by your physician -Make any legal decisions or sign important papers.  Meals: Start with liquid foods such as gelatin or soup. Progress to  regular foods as tolerated. Avoid greasy, spicy, heavy foods. If nausea and/or vomiting occur, drink only clear liquids until the nausea and/or vomiting subsides. Call your physician if vomiting continues.  Special Instructions/Symptoms: Your throat may feel dry or sore from the anesthesia or the breathing tube placed in your throat during surgery. If this causes discomfort, gargle with warm salt water. The discomfort should disappear within 24 hours.  If you had a scopolamine patch placed behind your ear for the management of post- operative nausea and/or vomiting:  1. The medication in the patch is effective for 72 hours, after which it should be removed.  Wrap patch in a tissue and discard in the trash. Wash hands thoroughly with soap and water. 2. You may remove the patch earlier than 72 hours if you experience unpleasant side effects which may include dry mouth, dizziness or visual disturbances. 3. Avoid touching the patch. Wash your hands with soap and water after contact with the patch.    

## 2022-06-19 ENCOUNTER — Encounter (HOSPITAL_BASED_OUTPATIENT_CLINIC_OR_DEPARTMENT_OTHER): Payer: Self-pay | Admitting: Obstetrics & Gynecology

## 2022-06-19 LAB — SURGICAL PATHOLOGY

## 2022-11-06 DIAGNOSIS — Z8632 Personal history of gestational diabetes: Secondary | ICD-10-CM | POA: Diagnosis not present

## 2022-11-06 DIAGNOSIS — E282 Polycystic ovarian syndrome: Secondary | ICD-10-CM | POA: Diagnosis not present

## 2022-11-06 DIAGNOSIS — Z13220 Encounter for screening for lipoid disorders: Secondary | ICD-10-CM | POA: Diagnosis not present

## 2022-11-06 DIAGNOSIS — Z1331 Encounter for screening for depression: Secondary | ICD-10-CM | POA: Diagnosis not present

## 2022-11-06 DIAGNOSIS — G43909 Migraine, unspecified, not intractable, without status migrainosus: Secondary | ICD-10-CM | POA: Diagnosis not present

## 2022-11-06 DIAGNOSIS — Z1339 Encounter for screening examination for other mental health and behavioral disorders: Secondary | ICD-10-CM | POA: Diagnosis not present

## 2022-11-06 DIAGNOSIS — Z0000 Encounter for general adult medical examination without abnormal findings: Secondary | ICD-10-CM | POA: Diagnosis not present

## 2022-11-12 DIAGNOSIS — Z3149 Encounter for other procreative investigation and testing: Secondary | ICD-10-CM | POA: Diagnosis not present

## 2022-11-14 DIAGNOSIS — Z319 Encounter for procreative management, unspecified: Secondary | ICD-10-CM | POA: Diagnosis not present

## 2022-12-25 DIAGNOSIS — Z319 Encounter for procreative management, unspecified: Secondary | ICD-10-CM | POA: Diagnosis not present

## 2023-02-05 ENCOUNTER — Telehealth: Payer: Self-pay | Admitting: Psychiatry

## 2023-02-05 NOTE — Telephone Encounter (Signed)
Pt LVM to schedule an appt for migraine with Dr. Delena Bali. Called pt and pt hung up after I introduced myself.

## 2023-03-18 IMAGING — CT CT HEAD W/O CM
3 of 4 series · 16 of 47 positions shown, 19 images · non-contrast
Comparison: None.

CLINICAL DATA: Severe headache for 9 days with dizziness, nausea
and vision changes.

EXAM:
CT HEAD WITHOUT CONTRAST
TECHNIQUE: Contiguous axial images were obtained from the base of the skull
through the vertex without intravenous contrast.

[Series 3: head 2.0 hr68 · axial · 0.39mm/px · z∈[-112,+32]mm · 10 of 81 slices shown, 13 images]
[im 5/81  brain]
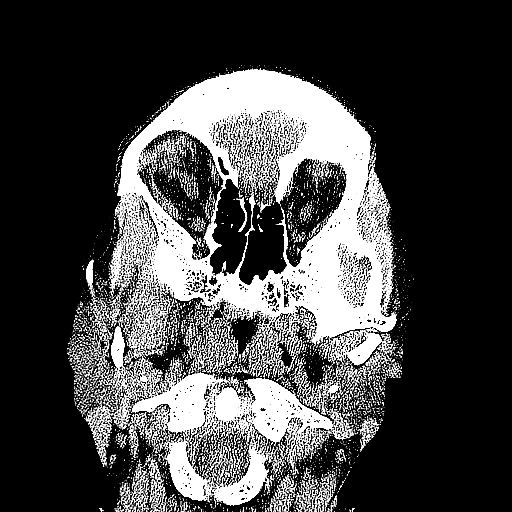
[im 5/81  bone]
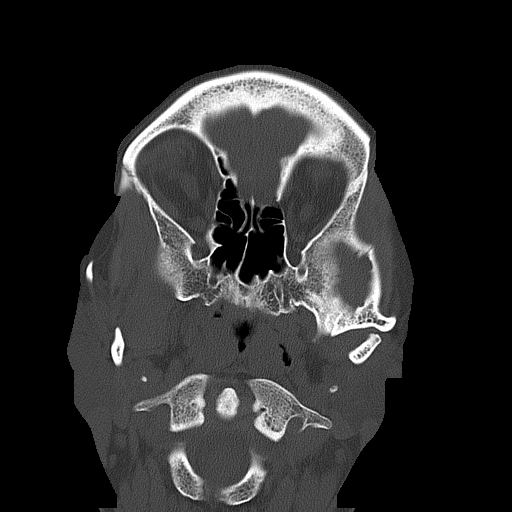
[im 13/81  brain]
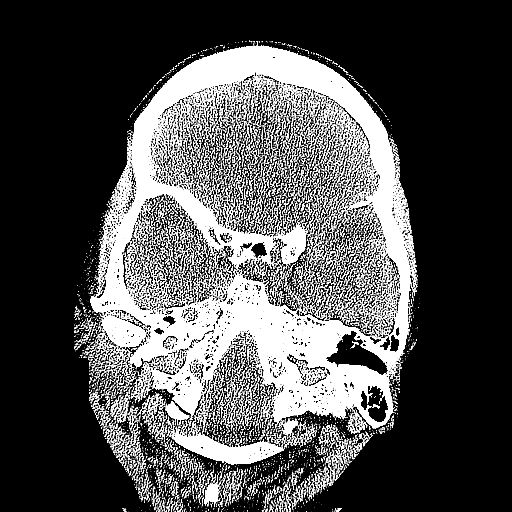
[im 21/81  brain]
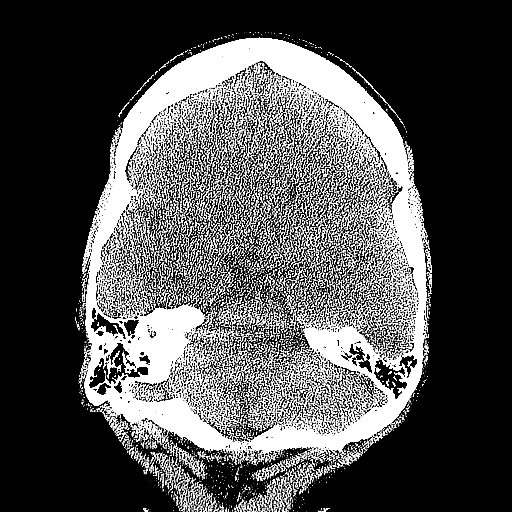
[im 29/81  brain]
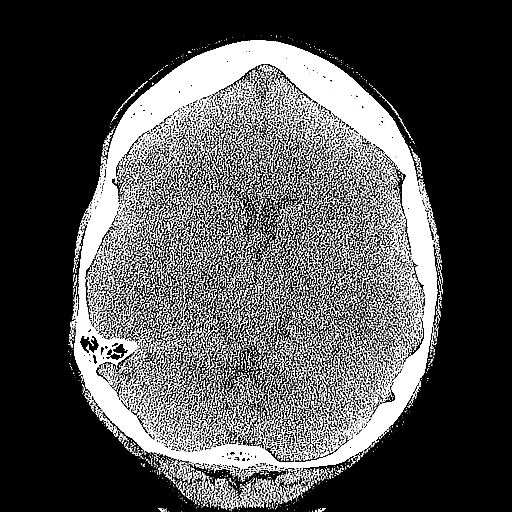
[im 37/81  brain]
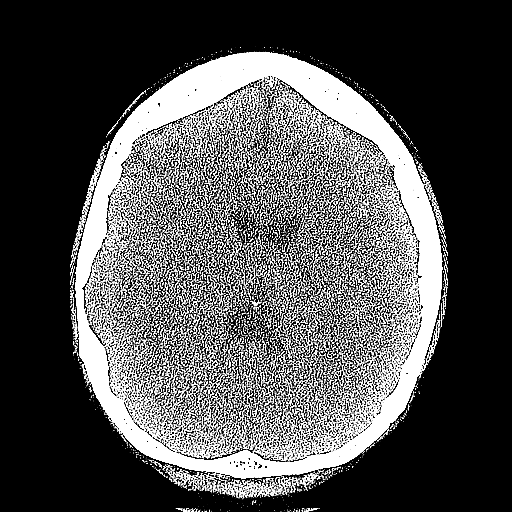
[im 37/81  bone]
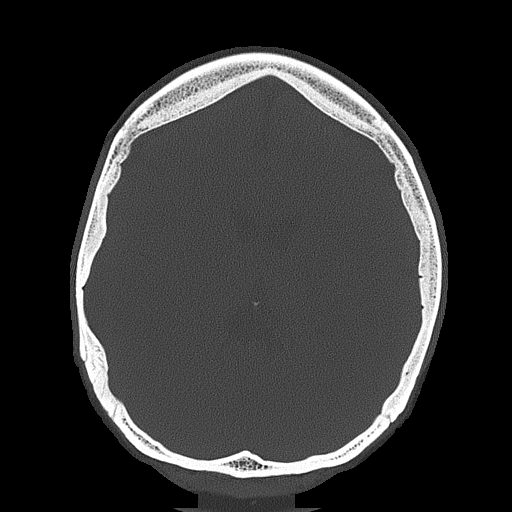
[im 45/81  brain]
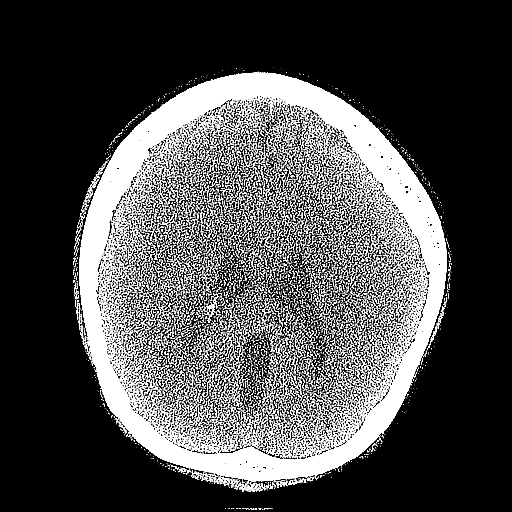
[im 53/81  brain]
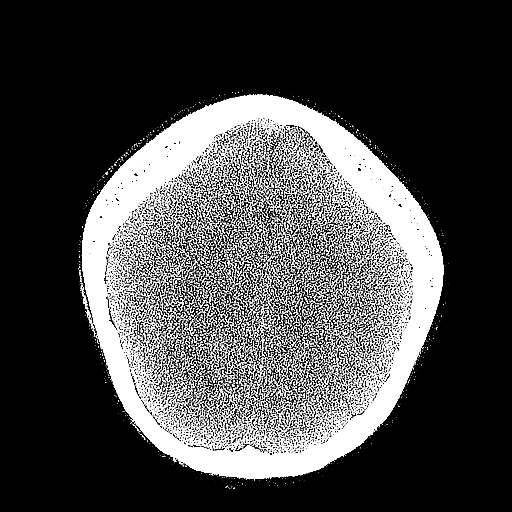
[im 61/81  brain]
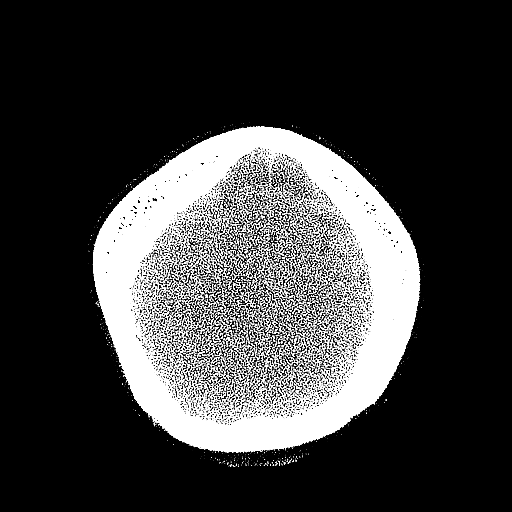
[im 69/81  brain]
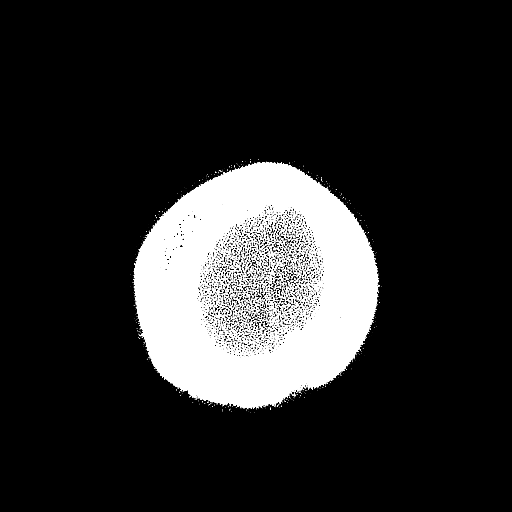
[im 69/81  bone]
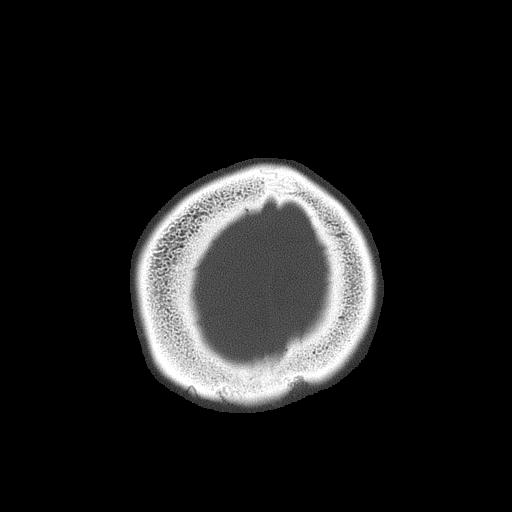
[im 77/81  brain]
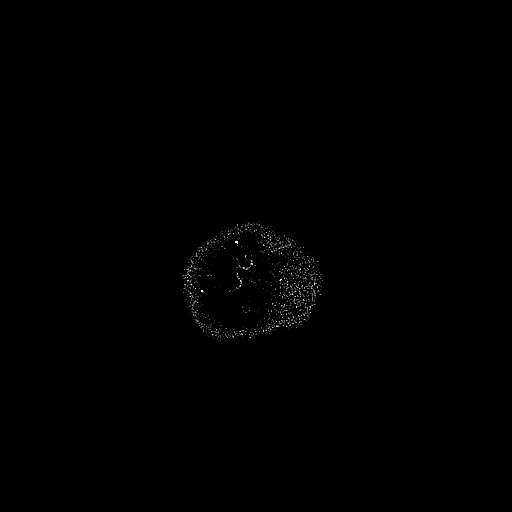

[Series 5: head 3.0 mpr coronal · coronal · 0.32mm/px · 3 of 64 slices shown]
[im 22/64  brain]
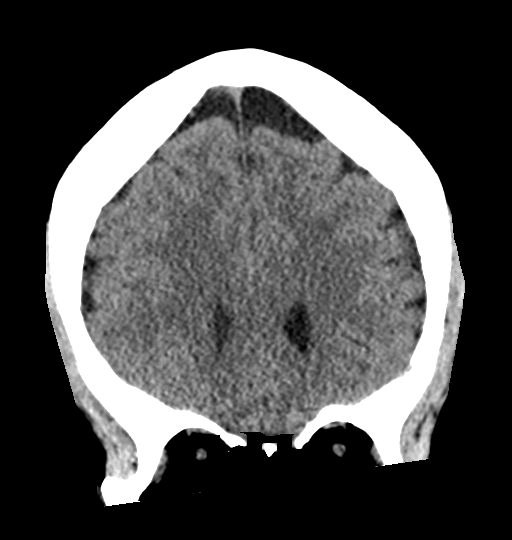
[im 29/64  brain]
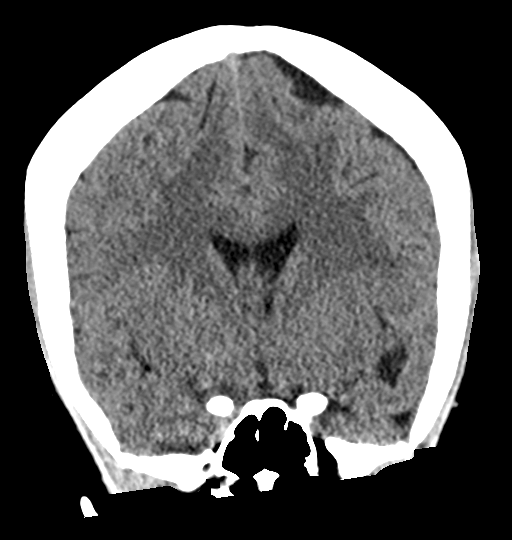
[im 36/64  brain]
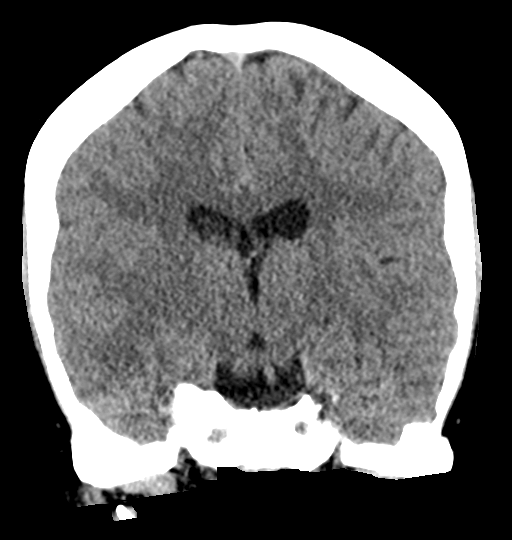

[Series 6: head 3.0 mpr sagittal · sagittal · 0.34mm/px · 3 of 55 slices shown]
[im 20/55  brain]
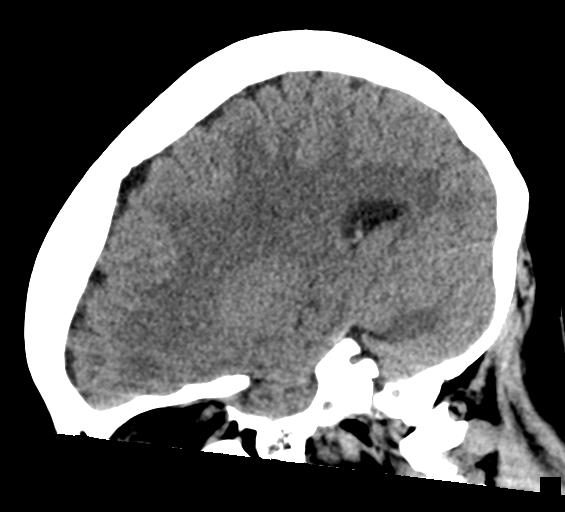
[im 28/55  brain]
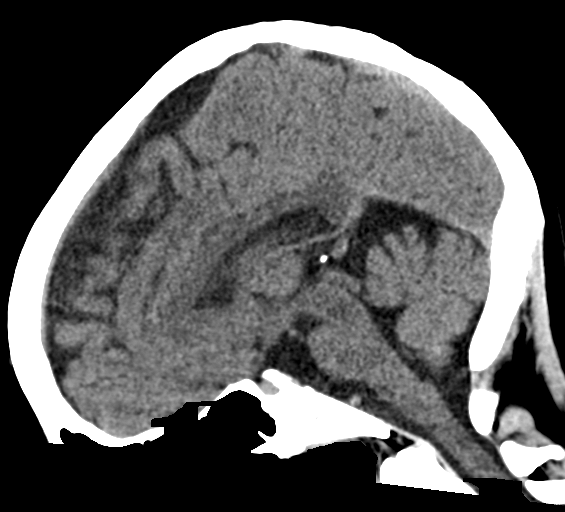
[im 35/55  brain]
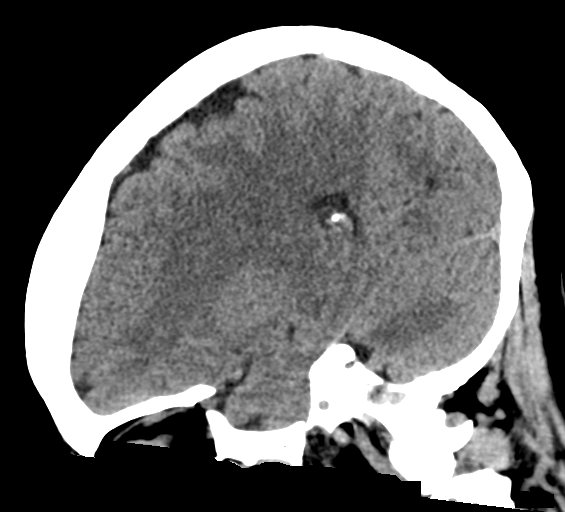

[16 of 47 positions shown; findings below may reference images not displayed]

FINDINGS: Brain: No evidence of acute infarction, hemorrhage, hydrocephalus,
extra-axial collection or mass lesion/mass effect.

Vascular: No hyperdense vessel or unexpected calcification.

Skull: Normal. Negative for fracture or focal lesion.

Sinuses/Orbits: Visualized globes and orbits are unremarkable and
the visualized sinuses are clear.

Other: None.
IMPRESSION: Normal unenhanced CT scan of the brain.

## 2023-04-28 DIAGNOSIS — Z1152 Encounter for screening for COVID-19: Secondary | ICD-10-CM | POA: Diagnosis not present

## 2023-04-28 DIAGNOSIS — M940 Chondrocostal junction syndrome [Tietze]: Secondary | ICD-10-CM | POA: Diagnosis not present

## 2023-04-28 DIAGNOSIS — J40 Bronchitis, not specified as acute or chronic: Secondary | ICD-10-CM | POA: Diagnosis not present

## 2023-04-28 DIAGNOSIS — R5383 Other fatigue: Secondary | ICD-10-CM | POA: Diagnosis not present

## 2023-04-28 DIAGNOSIS — J029 Acute pharyngitis, unspecified: Secondary | ICD-10-CM | POA: Diagnosis not present

## 2023-04-28 DIAGNOSIS — R051 Acute cough: Secondary | ICD-10-CM | POA: Diagnosis not present

## 2023-04-28 DIAGNOSIS — R0789 Other chest pain: Secondary | ICD-10-CM | POA: Diagnosis not present

## 2023-06-10 DIAGNOSIS — R062 Wheezing: Secondary | ICD-10-CM | POA: Diagnosis not present

## 2023-06-10 DIAGNOSIS — R058 Other specified cough: Secondary | ICD-10-CM | POA: Diagnosis not present

## 2023-06-10 DIAGNOSIS — J209 Acute bronchitis, unspecified: Secondary | ICD-10-CM | POA: Diagnosis not present

## 2023-08-04 DIAGNOSIS — J9809 Other diseases of bronchus, not elsewhere classified: Secondary | ICD-10-CM | POA: Diagnosis not present

## 2023-08-04 DIAGNOSIS — R058 Other specified cough: Secondary | ICD-10-CM | POA: Diagnosis not present

## 2023-09-01 DIAGNOSIS — J45909 Unspecified asthma, uncomplicated: Secondary | ICD-10-CM | POA: Diagnosis not present

## 2023-09-01 DIAGNOSIS — R053 Chronic cough: Secondary | ICD-10-CM | POA: Diagnosis not present

## 2023-09-01 DIAGNOSIS — R058 Other specified cough: Secondary | ICD-10-CM | POA: Diagnosis not present

## 2023-11-10 DIAGNOSIS — Z0000 Encounter for general adult medical examination without abnormal findings: Secondary | ICD-10-CM | POA: Diagnosis not present

## 2023-11-10 DIAGNOSIS — E282 Polycystic ovarian syndrome: Secondary | ICD-10-CM | POA: Diagnosis not present

## 2023-11-10 DIAGNOSIS — G43909 Migraine, unspecified, not intractable, without status migrainosus: Secondary | ICD-10-CM | POA: Diagnosis not present

## 2023-11-10 DIAGNOSIS — Z8632 Personal history of gestational diabetes: Secondary | ICD-10-CM | POA: Diagnosis not present

## 2023-11-10 DIAGNOSIS — Z1331 Encounter for screening for depression: Secondary | ICD-10-CM | POA: Diagnosis not present

## 2023-11-10 DIAGNOSIS — Z1339 Encounter for screening examination for other mental health and behavioral disorders: Secondary | ICD-10-CM | POA: Diagnosis not present

## 2023-11-10 DIAGNOSIS — R82998 Other abnormal findings in urine: Secondary | ICD-10-CM | POA: Diagnosis not present

## 2023-11-10 DIAGNOSIS — J9809 Other diseases of bronchus, not elsewhere classified: Secondary | ICD-10-CM | POA: Diagnosis not present

## 2024-01-16 DIAGNOSIS — J029 Acute pharyngitis, unspecified: Secondary | ICD-10-CM | POA: Diagnosis not present

## 2024-01-16 DIAGNOSIS — J45909 Unspecified asthma, uncomplicated: Secondary | ICD-10-CM | POA: Diagnosis not present

## 2024-01-16 DIAGNOSIS — J9809 Other diseases of bronchus, not elsewhere classified: Secondary | ICD-10-CM | POA: Diagnosis not present

## 2024-01-16 DIAGNOSIS — J189 Pneumonia, unspecified organism: Secondary | ICD-10-CM | POA: Diagnosis not present

## 2024-01-16 DIAGNOSIS — R0981 Nasal congestion: Secondary | ICD-10-CM | POA: Diagnosis not present

## 2024-01-16 DIAGNOSIS — R5383 Other fatigue: Secondary | ICD-10-CM | POA: Diagnosis not present

## 2024-01-16 DIAGNOSIS — R053 Chronic cough: Secondary | ICD-10-CM | POA: Diagnosis not present

## 2024-01-16 DIAGNOSIS — Z1152 Encounter for screening for COVID-19: Secondary | ICD-10-CM | POA: Diagnosis not present

## 2024-01-22 DIAGNOSIS — Z8701 Personal history of pneumonia (recurrent): Secondary | ICD-10-CM | POA: Diagnosis not present

## 2024-01-28 DIAGNOSIS — R053 Chronic cough: Secondary | ICD-10-CM | POA: Diagnosis not present

## 2024-01-28 DIAGNOSIS — K219 Gastro-esophageal reflux disease without esophagitis: Secondary | ICD-10-CM | POA: Diagnosis not present

## 2024-01-28 DIAGNOSIS — J189 Pneumonia, unspecified organism: Secondary | ICD-10-CM | POA: Diagnosis not present

## 2024-02-03 DIAGNOSIS — K449 Diaphragmatic hernia without obstruction or gangrene: Secondary | ICD-10-CM | POA: Diagnosis not present

## 2024-02-03 DIAGNOSIS — K219 Gastro-esophageal reflux disease without esophagitis: Secondary | ICD-10-CM | POA: Diagnosis not present

## 2024-03-02 DIAGNOSIS — K219 Gastro-esophageal reflux disease without esophagitis: Secondary | ICD-10-CM | POA: Diagnosis not present

## 2024-03-02 DIAGNOSIS — R053 Chronic cough: Secondary | ICD-10-CM | POA: Diagnosis not present

## 2024-03-02 DIAGNOSIS — J4540 Moderate persistent asthma, uncomplicated: Secondary | ICD-10-CM | POA: Diagnosis not present

## 2024-04-13 DIAGNOSIS — Z1331 Encounter for screening for depression: Secondary | ICD-10-CM | POA: Diagnosis not present

## 2024-04-13 DIAGNOSIS — Z01411 Encounter for gynecological examination (general) (routine) with abnormal findings: Secondary | ICD-10-CM | POA: Diagnosis not present

## 2024-04-13 DIAGNOSIS — Z113 Encounter for screening for infections with a predominantly sexual mode of transmission: Secondary | ICD-10-CM | POA: Diagnosis not present

## 2024-04-13 DIAGNOSIS — Z1231 Encounter for screening mammogram for malignant neoplasm of breast: Secondary | ICD-10-CM | POA: Diagnosis not present

## 2024-04-13 DIAGNOSIS — Z01419 Encounter for gynecological examination (general) (routine) without abnormal findings: Secondary | ICD-10-CM | POA: Diagnosis not present

## 2024-04-13 DIAGNOSIS — Z124 Encounter for screening for malignant neoplasm of cervix: Secondary | ICD-10-CM | POA: Diagnosis not present
